# Patient Record
Sex: Female | Born: 1960 | Race: White | Hispanic: No | Marital: Married | Smoking: Never smoker
Health system: Southern US, Community
[De-identification: ages and names within clinical notes are randomized; demographics above are authoritative.]

## PROBLEM LIST (undated history)

## (undated) DIAGNOSIS — R51 Headache: Secondary | ICD-10-CM

## (undated) DIAGNOSIS — I10 Essential (primary) hypertension: Secondary | ICD-10-CM

## (undated) DIAGNOSIS — Z6791 Unspecified blood type, Rh negative: Secondary | ICD-10-CM

## (undated) DIAGNOSIS — N189 Chronic kidney disease, unspecified: Secondary | ICD-10-CM

## (undated) DIAGNOSIS — R519 Headache, unspecified: Secondary | ICD-10-CM

## (undated) DIAGNOSIS — J45909 Unspecified asthma, uncomplicated: Secondary | ICD-10-CM

## (undated) HISTORY — PX: COLONOSCOPY: SHX5424

---

## 1998-03-08 ENCOUNTER — Ambulatory Visit (HOSPITAL_COMMUNITY): Admission: RE | Admit: 1998-03-08 | Discharge: 1998-03-08 | Payer: Self-pay | Admitting: Family Medicine

## 2002-06-16 ENCOUNTER — Encounter (INDEPENDENT_AMBULATORY_CARE_PROVIDER_SITE_OTHER): Payer: Self-pay | Admitting: Specialist

## 2002-06-16 ENCOUNTER — Encounter: Payer: Self-pay | Admitting: Surgery

## 2002-06-16 ENCOUNTER — Encounter: Admission: RE | Admit: 2002-06-16 | Discharge: 2002-06-16 | Payer: Self-pay | Admitting: Surgery

## 2002-07-27 ENCOUNTER — Ambulatory Visit (HOSPITAL_COMMUNITY): Admission: RE | Admit: 2002-07-27 | Discharge: 2002-07-27 | Payer: Self-pay | Admitting: Gastroenterology

## 2003-06-25 ENCOUNTER — Encounter: Admission: RE | Admit: 2003-06-25 | Discharge: 2003-06-25 | Payer: Self-pay | Admitting: Family Medicine

## 2004-06-15 ENCOUNTER — Other Ambulatory Visit: Admission: RE | Admit: 2004-06-15 | Discharge: 2004-06-15 | Payer: Self-pay | Admitting: Family Medicine

## 2004-08-04 ENCOUNTER — Encounter: Admission: RE | Admit: 2004-08-04 | Discharge: 2004-08-04 | Payer: Self-pay | Admitting: Family Medicine

## 2005-07-16 ENCOUNTER — Other Ambulatory Visit: Admission: RE | Admit: 2005-07-16 | Discharge: 2005-07-16 | Payer: Self-pay | Admitting: Family Medicine

## 2005-08-27 ENCOUNTER — Encounter: Admission: RE | Admit: 2005-08-27 | Discharge: 2005-08-27 | Payer: Self-pay | Admitting: Family Medicine

## 2006-10-17 ENCOUNTER — Other Ambulatory Visit: Admission: RE | Admit: 2006-10-17 | Discharge: 2006-10-17 | Payer: Self-pay | Admitting: Family Medicine

## 2006-10-29 ENCOUNTER — Encounter: Admission: RE | Admit: 2006-10-29 | Discharge: 2006-10-29 | Payer: Self-pay | Admitting: Family Medicine

## 2007-02-06 ENCOUNTER — Other Ambulatory Visit: Admission: RE | Admit: 2007-02-06 | Discharge: 2007-02-06 | Payer: Self-pay | Admitting: Family Medicine

## 2007-11-17 ENCOUNTER — Encounter: Admission: RE | Admit: 2007-11-17 | Discharge: 2007-11-17 | Payer: Self-pay | Admitting: Family Medicine

## 2008-08-27 ENCOUNTER — Ambulatory Visit: Payer: Self-pay | Admitting: Diagnostic Radiology

## 2008-08-27 ENCOUNTER — Emergency Department (HOSPITAL_BASED_OUTPATIENT_CLINIC_OR_DEPARTMENT_OTHER): Admission: EM | Admit: 2008-08-27 | Discharge: 2008-08-28 | Payer: Self-pay | Admitting: Emergency Medicine

## 2008-09-02 ENCOUNTER — Emergency Department (HOSPITAL_BASED_OUTPATIENT_CLINIC_OR_DEPARTMENT_OTHER): Admission: EM | Admit: 2008-09-02 | Discharge: 2008-09-02 | Payer: Self-pay | Admitting: Emergency Medicine

## 2008-12-14 ENCOUNTER — Other Ambulatory Visit: Admission: RE | Admit: 2008-12-14 | Discharge: 2008-12-14 | Payer: Self-pay | Admitting: Family Medicine

## 2010-01-04 ENCOUNTER — Encounter: Admission: RE | Admit: 2010-01-04 | Discharge: 2010-01-04 | Payer: Self-pay | Admitting: Family Medicine

## 2010-04-28 ENCOUNTER — Other Ambulatory Visit: Admission: RE | Admit: 2010-04-28 | Discharge: 2010-04-28 | Payer: Self-pay | Admitting: Family Medicine

## 2010-12-04 LAB — BASIC METABOLIC PANEL
BUN: 19 mg/dL (ref 6–23)
Calcium: 9.6 mg/dL (ref 8.4–10.5)
Creatinine, Ser: 1 mg/dL (ref 0.4–1.2)
GFR calc non Af Amer: 60 mL/min — ABNORMAL LOW (ref >60)
Glucose, Bld: 119 mg/dL — ABNORMAL HIGH (ref 70–99)
Potassium: 3.8 mEq/L (ref 3.5–5.1)

## 2010-12-04 LAB — CBC
Platelets: 278 10*3/uL (ref 150–400)
RDW: 12.4 % (ref 11.5–15.5)
WBC: 12.4 10*3/uL — ABNORMAL HIGH (ref 4.0–10.5)

## 2010-12-04 LAB — DIFFERENTIAL
Basophils Absolute: 0.1 10*3/uL (ref 0.0–0.1)
Eosinophils Relative: 2 % (ref 0–5)
Lymphocytes Relative: 25 % (ref 12–46)
Lymphs Abs: 3.2 10*3/uL (ref 0.7–4.0)
Neutro Abs: 7.8 10*3/uL — ABNORMAL HIGH (ref 1.7–7.7)
Neutrophils Relative %: 63 % (ref 43–77)

## 2011-01-05 NOTE — Op Note (Signed)
   NAME:  Rhonda Walker, Rhonda Walker                          ACCOUNT NO.:  [id number]   MEDICAL RECORD NO.:  [id number]                   PATIENT TYPE:  AMB   LOCATION:  ENDO                                 FACILITY:  [facility]   PHYSICIAN:  John C. Madilyn Fireman, M.D.                 DATE OF BIRTH:  Apr 29, 1961   DATE OF PROCEDURE:  07/27/2002  DATE OF DISCHARGE:                                 OPERATIVE REPORT   INDICATIONS FOR PROCEDURE:  Family history of colon cancer in a first-degree  relative.   PROCEDURE:  The patient was placed in the left lateral decubitus position  and placed on the pulse monitor with continuous low-flow oxygen delivered by  nasal cannula.  She was sedated with 75 mcg of IV fentanyl and 6 mg of IV  Versed.  The Olympus video colonoscope was inserted into the rectum and  advanced to the cecum, confirmed by transillumination of the McBurney's  point and visualization of the ileocecal valve and appendiceal orifice.  The  prep was excellent.  The cecum, ascending, transverse, descending, and  sigmoid colon all appeared normal with no masses, polyps, diverticula, or  other mucosal abnormalities.  The rectum likewise appeared normal, and the  retroflexed view of the anus revealed no obvious internal hemorrhoids.  The  colonoscope was then withdrawn, and the patient was returned to the recovery  room in stable condition.  She tolerated the procedure well, and there were  no immediate complications.   IMPRESSION:  Normal colonoscopy.   PLAN:  Repeat study in five years.                                               John C. Madilyn Fireman, M.D.    JCH/MEDQ  D:  07/27/2002  T:  07/27/2002  Job:  [id number]   cc:   Jethro Bastos, M.D.  [address]  [place]  [place] [address]  Fax: [phone]

## 2011-04-19 ENCOUNTER — Other Ambulatory Visit: Payer: Self-pay | Admitting: Family Medicine

## 2011-04-19 DIAGNOSIS — Z1231 Encounter for screening mammogram for malignant neoplasm of breast: Secondary | ICD-10-CM

## 2011-04-27 ENCOUNTER — Ambulatory Visit
Admission: RE | Admit: 2011-04-27 | Discharge: 2011-04-27 | Disposition: A | Discharge status: Home / Self Care | Payer: BC Managed Care – PPO | Source: Ambulatory Visit | Attending: Family Medicine | Admitting: Family Medicine

## 2011-04-27 DIAGNOSIS — Z1231 Encounter for screening mammogram for malignant neoplasm of breast: Secondary | ICD-10-CM

## 2013-06-24 ENCOUNTER — Other Ambulatory Visit: Payer: Self-pay | Admitting: Family Medicine

## 2013-06-24 ENCOUNTER — Other Ambulatory Visit (HOSPITAL_COMMUNITY)
Admission: RE | Admit: 2013-06-24 | Discharge: 2013-06-24 | Disposition: A | Discharge status: Home / Self Care | Payer: BC Managed Care – PPO | Source: Ambulatory Visit | Attending: Family Medicine | Admitting: Family Medicine

## 2013-06-24 DIAGNOSIS — Z124 Encounter for screening for malignant neoplasm of cervix: Secondary | ICD-10-CM | POA: Insufficient documentation

## 2013-11-16 ENCOUNTER — Other Ambulatory Visit: Payer: Self-pay

## 2013-11-16 DIAGNOSIS — Z1231 Encounter for screening mammogram for malignant neoplasm of breast: Secondary | ICD-10-CM

## 2013-12-28 ENCOUNTER — Encounter (INDEPENDENT_AMBULATORY_CARE_PROVIDER_SITE_OTHER): Payer: Self-pay

## 2013-12-28 ENCOUNTER — Ambulatory Visit
Admission: RE | Admit: 2013-12-28 | Discharge: 2013-12-28 | Disposition: A | Discharge status: Home / Self Care | Payer: BC Managed Care – PPO | Source: Ambulatory Visit

## 2013-12-28 DIAGNOSIS — Z1231 Encounter for screening mammogram for malignant neoplasm of breast: Secondary | ICD-10-CM

## 2015-03-11 ENCOUNTER — Other Ambulatory Visit: Payer: Self-pay | Admitting: Obstetrics and Gynecology

## 2015-04-05 ENCOUNTER — Other Ambulatory Visit: Payer: Self-pay

## 2015-04-05 ENCOUNTER — Encounter (HOSPITAL_COMMUNITY)
Admission: RE | Admit: 2015-04-05 | Discharge: 2015-04-05 | Disposition: A | Discharge status: Home / Self Care | Payer: BLUE CROSS/BLUE SHIELD | Source: Ambulatory Visit | Attending: Obstetrics and Gynecology | Admitting: Obstetrics and Gynecology

## 2015-04-05 ENCOUNTER — Encounter (HOSPITAL_COMMUNITY): Payer: Self-pay

## 2015-04-05 DIAGNOSIS — N939 Abnormal uterine and vaginal bleeding, unspecified: Secondary | ICD-10-CM | POA: Diagnosis not present

## 2015-04-05 DIAGNOSIS — Z01818 Encounter for other preprocedural examination: Secondary | ICD-10-CM | POA: Diagnosis not present

## 2015-04-05 HISTORY — DX: Unspecified asthma, uncomplicated: J45.909

## 2015-04-05 HISTORY — DX: Essential (primary) hypertension: I10

## 2015-04-05 HISTORY — DX: Headache, unspecified: R51.9

## 2015-04-05 HISTORY — DX: Chronic kidney disease, unspecified: N18.9

## 2015-04-05 HISTORY — DX: Unspecified blood type, rh negative: Z67.91

## 2015-04-05 HISTORY — DX: Headache: R51

## 2015-04-05 LAB — BASIC METABOLIC PANEL
Anion gap: 8 (ref 5–15)
BUN: 15 mg/dL (ref 6–20)
CO2: 25 mmol/L (ref 22–32)
CREATININE: 0.96 mg/dL (ref 0.44–1.00)
Calcium: 9.5 mg/dL (ref 8.9–10.3)
Chloride: 107 mmol/L (ref 101–111)
Glucose, Bld: 96 mg/dL (ref 65–99)
POTASSIUM: 4.2 mmol/L (ref 3.5–5.1)
SODIUM: 140 mmol/L (ref 135–145)

## 2015-04-05 LAB — CBC
HCT: 36.6 % (ref 36.0–46.0)
HEMOGLOBIN: 12.2 g/dL (ref 12.0–15.0)
MCH: 30.7 pg (ref 26.0–34.0)
MCHC: 33.3 g/dL (ref 30.0–36.0)
MCV: 92.2 fL (ref 78.0–100.0)
PLATELETS: 329 10*3/uL (ref 150–400)
RBC: 3.97 MIL/uL (ref 3.87–5.11)
RDW: 14 % (ref 11.5–15.5)
WBC: 8.4 10*3/uL (ref 4.0–10.5)

## 2015-04-05 NOTE — Patient Instructions (Signed)
Your procedure is scheduled on:04/13/15  Enter through the Main Entrance at :6am Pick up desk phone and dial [phone] and inform us of your arrival.  Please call [phone] if you have any problems the morning of surgery.  Remember: Do not eat food or drink liquids, including water, after midnight:Tuesday   You may brush your teeth the morning of surgery.  Take these meds the morning of surgery with a sip of water:BP meds  DO NOT wear jewelry, eye make-up, lipstick,body lotion, or dark fingernail polish.  (Polished toes are ok) You may wear deodorant.  If you are to be admitted after surgery, leave suitcase in car until your room has been assigned. Patients discharged on the day of surgery will not be allowed to drive home. Wear loose fitting, comfortable clothes for your ride home.

## 2015-04-06 NOTE — Anesthesia Preprocedure Evaluation (Addendum)
Anesthesia Evaluation  Patient identified by MRN, date of birth, ID band Patient awake    Reviewed: Allergy & Precautions, NPO status , Patient's Chart, lab work & pertinent test results  Airway Mallampati: II   Neck ROM: Full    Dental  (+) Caps, Dental Advisory Given, Teeth Intact   Pulmonary  breath sounds clear to auscultation        Cardiovascular hypertension, Pt. on medications Rhythm:Regular  EKG 03/2015 OK   Neuro/Psych  Headaches,    GI/Hepatic   Endo/Other  BMI 35, obesity  Renal/GU      Musculoskeletal   Abdominal (+)  Abdomen: soft.    Peds  Hematology 12/36   Anesthesia Other Findings   Reproductive/Obstetrics                          Anesthesia Physical Anesthesia Plan  ASA: II  Anesthesia Plan: General   Post-op Pain Management:    Induction: Intravenous  Airway Management Planned: LMA and Oral ETT  Additional Equipment:   Intra-op Plan:   Post-operative Plan:   Informed Consent: I have reviewed the patients History and Physical, chart, labs and discussed the procedure including the risks, benefits and alternatives for the proposed anesthesia with the patient or authorized representative who has indicated his/her understanding and acceptance.     Plan Discussed with:   Anesthesia Plan Comments:         Anesthesia Quick Evaluation

## 2015-04-11 ENCOUNTER — Encounter: Payer: Self-pay | Admitting: Allergy and Immunology

## 2015-04-11 DIAGNOSIS — J309 Allergic rhinitis, unspecified: Secondary | ICD-10-CM | POA: Insufficient documentation

## 2015-04-12 NOTE — H&P (Signed)
History of Present Illness  General:  54 y/o presents for ultrasound ordered to evaluate AUB. EMB previously done showed an endometrial polyp but no hyperplasia or malignancy. Pt had prolonged bleeding after she did not have menses for ~ 6 months and they have been heavy since. Pt had episode of bleeding Pt stopped Provera on Sunday. Bleeding resumed on Tuesday. Bleeding stopped 5 days after starting Provera. Agreeable to proceed with definitive management.   Current Medications  Taking   Multivitamins Tablet 1 tablet once a day   Calcium  Tablet 1 tablet once a day   Labetalol HCl 300 MG Tablet 1 tablet twice a day   Spironolactone 50 MG Tablet 1 tablet twice a day   Azelastine HCl 0.15 % Solution 1 drop in each nostril Twice a day   Albuterol Sulfate HFA 108 (90 Base) MCG/ACT Aerosol Solution 2 puffs as needed every 4 hrs   Provera(Medroxyprogesterone) 10 MG Tablet 1 table One tablet daiy   Medication List reviewed and reconciled with the patient    Past Medical History  Acne  Migraine headaches  Depression  F. hx. colon cancer - mother dx 12/1994  Hypertension  polycystic ovary (normal pelvic US 2010)  Congenital nystagmus  actinic keratosis, DER: [facility] in [place]  low HDL  Prediabetes/metabolic syndrome   Surgical History  Abortion 02/1998  colonoscopy 06/06/2009,06/2014   Family History  Father: deceased 56 yrs, skin cancers, high blood pressure, heart disease, diabetes, alcoholism, COPD, diagnosed with DM, HTN  Mother: alive 14 yrs, hx colon cancer, high blood pressure, diagnosed with HTN, Colon Ca  Brother 1: alive 50 yrs, A + W  Sister 1: alive 52 yrs, hypertension, squamous cell skin cancers, diagnosed with HTN  NEG GI FAMILY HX of colon polyps and liver disease.   Social History  General:  Tobacco use  cigarettes: Never smoked Tobacco history last updated 03/29/2015 no Exposed to passive smoke.  Alcohol: yes, wine, 1 glass a month .  Caffeine: yes,  coffee, 1 serving daily.  no Recreational drug use.  Exercise: yes, walk occasionally .  Occupation: employed, Psychologist, educational .  Marital Status: Married.  Children: none.  Seat belt use: yes.    Gyn History  Sexual activity currently sexually active.  Periods : irregular.  LMP 01/24/15 x 4 weeks then stopped a few days and then started again,stopped with provera x 10 days then started again after finishing the provera. Taking provera again per Dr. Reine Just pt sees Dr. Seth Bake..  Denies H/O Birth control.  Last pap smear date 06/24/13 normal .  Last mammogram date 12/28/13 normal .  Abnormal pap smear none.  GYN procedures Endometrial Biopsy, 03/11/15, benign.    OB History  Number of pregnancies Gravida 1; Para 0; AB 1.  Pregnancy # 1 abortion.    Allergies  N.K.D.A.   Hospitalization/Major Diagnostic Procedure  Denies Past Hospitalization   Review of Systems  Denies fever/chills, chest pain, SOB, headaches, numbness/tingling. No h/o complication with anesthesia, bleeding disorders or blood clots.   Vital Signs  Wt 210, Ht 64, BMI 36.04, Pulse sitting 77, BP sitting 141/80.   Physical Examination  GENERAL:  Patient appears alert and oriented.  General Appearance: well-appearing, well-developed, no acute distress.  Speech: clear.  LUNGS:  Auscultation: no wheezing/rhonchi/rales. CTA bilaterally.  HEART:  Heart sounds: normal. RRR. no murmur.  ABDOMEN:  General: soft nontender, nondistended, no masses.  FEMALE GENITOURINARY:  Pelvic Not examined.  EXTREMITIES:  General: No edema or calf  tenderness.     Assessments   1. Abnormal uterine bleeding (AUB) - N93.9 (Primary)   2. Pre-operative clearance - Z01.818   Treatment  1. Abnormal uterine bleeding (AUB)  Refill Provera Tablet, 10 MG, 1 table, Orally, One tablet daiy, 10 days, 10, Refills 0   2. Pre-operative clearance  Clinical Notes: Pt counseled on R/B/A to include injury to uterus, bleeding and infection. Pt  understands procedure is diagnostic.    3. Others  Endometrial polyp   Referral ZO:[facility]cology Reason:Precert for Hyst/D&C, Polypectomy with MyosureNeeds surgical clearance, no asssitant needed    Follow Up  2 Weeks post op

## 2015-04-13 ENCOUNTER — Ambulatory Visit (HOSPITAL_COMMUNITY)
Admission: RE | Admit: 2015-04-13 | Discharge: 2015-04-13 | Disposition: A | Discharge status: Home / Self Care | Payer: BLUE CROSS/BLUE SHIELD | Source: Ambulatory Visit | Attending: Obstetrics and Gynecology | Admitting: Obstetrics and Gynecology

## 2015-04-13 ENCOUNTER — Ambulatory Visit (HOSPITAL_COMMUNITY): Payer: BLUE CROSS/BLUE SHIELD | Admitting: Anesthesiology

## 2015-04-13 ENCOUNTER — Encounter (HOSPITAL_COMMUNITY)
Admission: RE | Disposition: A | Discharge status: Home / Self Care | Payer: Self-pay | Source: Ambulatory Visit | Attending: Obstetrics and Gynecology

## 2015-04-13 ENCOUNTER — Encounter (HOSPITAL_COMMUNITY): Payer: Self-pay | Admitting: *Deleted

## 2015-04-13 DIAGNOSIS — Z809 Family history of malignant neoplasm, unspecified: Secondary | ICD-10-CM | POA: Diagnosis not present

## 2015-04-13 DIAGNOSIS — Z833 Family history of diabetes mellitus: Secondary | ICD-10-CM | POA: Diagnosis not present

## 2015-04-13 DIAGNOSIS — E669 Obesity, unspecified: Secondary | ICD-10-CM | POA: Insufficient documentation

## 2015-04-13 DIAGNOSIS — Z79899 Other long term (current) drug therapy: Secondary | ICD-10-CM | POA: Diagnosis not present

## 2015-04-13 DIAGNOSIS — Z800 Family history of malignant neoplasm of digestive organs: Secondary | ICD-10-CM | POA: Diagnosis not present

## 2015-04-13 DIAGNOSIS — I10 Essential (primary) hypertension: Secondary | ICD-10-CM | POA: Insufficient documentation

## 2015-04-13 DIAGNOSIS — H5501 Congenital nystagmus: Secondary | ICD-10-CM | POA: Diagnosis not present

## 2015-04-13 DIAGNOSIS — Q513 Bicornate uterus: Secondary | ICD-10-CM | POA: Diagnosis not present

## 2015-04-13 DIAGNOSIS — Z8249 Family history of ischemic heart disease and other diseases of the circulatory system: Secondary | ICD-10-CM | POA: Insufficient documentation

## 2015-04-13 DIAGNOSIS — N840 Polyp of corpus uteri: Secondary | ICD-10-CM | POA: Insufficient documentation

## 2015-04-13 DIAGNOSIS — R7309 Other abnormal glucose: Secondary | ICD-10-CM | POA: Diagnosis not present

## 2015-04-13 DIAGNOSIS — E8881 Metabolic syndrome: Secondary | ICD-10-CM | POA: Insufficient documentation

## 2015-04-13 DIAGNOSIS — Z825 Family history of asthma and other chronic lower respiratory diseases: Secondary | ICD-10-CM | POA: Insufficient documentation

## 2015-04-13 DIAGNOSIS — N939 Abnormal uterine and vaginal bleeding, unspecified: Secondary | ICD-10-CM

## 2015-04-13 DIAGNOSIS — F329 Major depressive disorder, single episode, unspecified: Secondary | ICD-10-CM | POA: Diagnosis not present

## 2015-04-13 HISTORY — PX: DILATATION & CURETTAGE/HYSTEROSCOPY WITH MYOSURE: SHX6511

## 2015-04-13 SURGERY — DILATATION & CURETTAGE/HYSTEROSCOPY WITH MYOSURE
Anesthesia: General | Site: Vagina

## 2015-04-13 MED ORDER — KETOROLAC TROMETHAMINE 30 MG/ML IJ SOLN
INTRAMUSCULAR | Status: AC
  Filled 2015-04-13: qty 1

## 2015-04-13 MED ORDER — PROMETHAZINE HCL 25 MG/ML IJ SOLN
6.2500 mg | INTRAMUSCULAR | Status: DC | PRN
Start: 2015-04-13 — End: 2015-04-13

## 2015-04-13 MED ORDER — LIDOCAINE HCL (CARDIAC) 20 MG/ML IV SOLN
INTRAVENOUS | Status: DC | PRN
  Administered 2015-04-13: 100 mg via INTRAVENOUS
  Administered 2015-04-13: 50 mg via INTRAVENOUS

## 2015-04-13 MED ORDER — FENTANYL CITRATE (PF) 100 MCG/2ML IJ SOLN
INTRAMUSCULAR | Status: AC
  Filled 2015-04-13: qty 4

## 2015-04-13 MED ORDER — PROPOFOL 10 MG/ML IV BOLUS
INTRAVENOUS | Status: DC | PRN
  Administered 2015-04-13: 200 mg via INTRAVENOUS
  Administered 2015-04-13: 50 mg via INTRAVENOUS

## 2015-04-13 MED ORDER — LIDOCAINE HCL (CARDIAC) 20 MG/ML IV SOLN
INTRAVENOUS | Status: AC
  Filled 2015-04-13: qty 5

## 2015-04-13 MED ORDER — IBUPROFEN 600 MG PO TABS: 600.0000 mg | ORAL_TABLET | Freq: Four times a day (QID) | ORAL | Status: DC | PRN

## 2015-04-13 MED ORDER — CEFAZOLIN SODIUM-DEXTROSE 2-3 GM-% IV SOLR
2.0000 g | INTRAVENOUS | Status: AC
  Administered 2015-04-13: 2 g via INTRAVENOUS

## 2015-04-13 MED ORDER — PROPOFOL 10 MG/ML IV BOLUS
INTRAVENOUS | Status: AC
  Filled 2015-04-13: qty 20

## 2015-04-13 MED ORDER — DEXAMETHASONE SODIUM PHOSPHATE 4 MG/ML IJ SOLN
INTRAMUSCULAR | Status: AC
  Filled 2015-04-13: qty 1

## 2015-04-13 MED ORDER — SCOPOLAMINE 1 MG/3DAYS TD PT72
1.0000 | MEDICATED_PATCH | Freq: Once | TRANSDERMAL | Status: DC
  Administered 2015-04-13: 1.5 mg via TRANSDERMAL

## 2015-04-13 MED ORDER — LACTATED RINGERS IV SOLN
INTRAVENOUS | Status: DC
  Administered 2015-04-13: 07:00:00 via INTRAVENOUS

## 2015-04-13 MED ORDER — LIDOCAINE HCL 2 % IJ SOLN
INTRAMUSCULAR | Status: AC
  Filled 2015-04-13: qty 20

## 2015-04-13 MED ORDER — MEPERIDINE HCL 25 MG/ML IJ SOLN: 6.2500 mg | INTRAMUSCULAR | Status: DC | PRN

## 2015-04-13 MED ORDER — FENTANYL CITRATE (PF) 100 MCG/2ML IJ SOLN: 25.0000 ug | INTRAMUSCULAR | Status: DC | PRN

## 2015-04-13 MED ORDER — SODIUM CHLORIDE 0.9 % IR SOLN
Status: DC | PRN
  Administered 2015-04-13: 3000 mL

## 2015-04-13 MED ORDER — SCOPOLAMINE 1 MG/3DAYS TD PT72
MEDICATED_PATCH | TRANSDERMAL | Status: AC
  Administered 2015-04-13: 1.5 mg via TRANSDERMAL
  Filled 2015-04-13: qty 1

## 2015-04-13 MED ORDER — DEXAMETHASONE SODIUM PHOSPHATE 10 MG/ML IJ SOLN
INTRAMUSCULAR | Status: DC | PRN
  Administered 2015-04-13: 4 mg via INTRAVENOUS

## 2015-04-13 MED ORDER — SILVER NITRATE-POT NITRATE 75-25 % EX MISC
CUTANEOUS | Status: DC | PRN
  Administered 2015-04-13: 2

## 2015-04-13 MED ORDER — FENTANYL CITRATE (PF) 100 MCG/2ML IJ SOLN
INTRAMUSCULAR | Status: DC | PRN
  Administered 2015-04-13 (×2): 50 ug via INTRAVENOUS

## 2015-04-13 MED ORDER — MIDAZOLAM HCL 2 MG/2ML IJ SOLN
INTRAMUSCULAR | Status: AC
  Filled 2015-04-13: qty 4

## 2015-04-13 MED ORDER — SILVER NITRATE-POT NITRATE 75-25 % EX MISC
CUTANEOUS | Status: AC
  Filled 2015-04-13: qty 1

## 2015-04-13 MED ORDER — KETOROLAC TROMETHAMINE 30 MG/ML IJ SOLN
INTRAMUSCULAR | Status: DC | PRN
  Administered 2015-04-13: 30 mg via INTRAVENOUS

## 2015-04-13 MED ORDER — ONDANSETRON HCL 4 MG/2ML IJ SOLN
INTRAMUSCULAR | Status: DC | PRN
  Administered 2015-04-13: 4 mg via INTRAVENOUS

## 2015-04-13 MED ORDER — MIDAZOLAM HCL 2 MG/2ML IJ SOLN
INTRAMUSCULAR | Status: DC | PRN
  Administered 2015-04-13: 2 mg via INTRAVENOUS

## 2015-04-13 MED ORDER — ONDANSETRON HCL 4 MG/2ML IJ SOLN
INTRAMUSCULAR | Status: AC
  Filled 2015-04-13: qty 2

## 2015-04-13 MED ORDER — CEFAZOLIN SODIUM-DEXTROSE 2-3 GM-% IV SOLR
INTRAVENOUS | Status: AC
  Filled 2015-04-13: qty 50

## 2015-04-13 MED ORDER — LIDOCAINE HCL 2 % IJ SOLN
INTRAMUSCULAR | Status: DC | PRN
  Administered 2015-04-13: 10 mL

## 2015-04-13 SURGICAL SUPPLY — 14 items
CANISTERS HI-FLOW 3000CC (CANNISTER) ×4 IMPLANT
CATH ROBINSON RED A/P 16FR (CATHETERS) ×2 IMPLANT
CLOTH BEACON ORANGE TIMEOUT ST (SAFETY) ×2 IMPLANT
CONTAINER PREFILL 10% NBF 60ML (FORM) ×2 IMPLANT
DECANTER SPIKE VIAL GLASS SM (MISCELLANEOUS) ×2 IMPLANT
DEVICE MYOSURE LITE (MISCELLANEOUS) ×2 IMPLANT
GLOVE BIO SURGEON STRL SZ7 (GLOVE) ×4 IMPLANT
GLOVE BIOGEL PI IND STRL 7.0 (GLOVE) IMPLANT
GLOVE BIOGEL PI INDICATOR 7.0 (GLOVE) ×2
GOWN STRL REUS W/TWL LRG LVL3 (GOWN DISPOSABLE) ×6 IMPLANT
PACK VAGINAL MINOR WOMEN LF (CUSTOM PROCEDURE TRAY) ×2 IMPLANT
PAD OB MATERNITY 4.3X12.25 (PERSONAL CARE ITEMS) ×2 IMPLANT
PAD PREP 24X48 CUFFED NSTRL (MISCELLANEOUS) ×2 IMPLANT
TOWEL OR 17X24 6PK STRL BLUE (TOWEL DISPOSABLE) ×4 IMPLANT

## 2015-04-13 NOTE — Discharge Instructions (Addendum)
Dilation and Curettage or Vacuum Curettage, Care After Refer to this sheet in the next few weeks. These instructions provide you with information on caring for yourself after your procedure. Your health care provider may also give you more specific instructions. Your treatment has been planned according to current medical practices, but problems sometimes occur. Call your health care provider if you have any problems or questions after your procedure. WHAT TO EXPECT AFTER THE PROCEDURE After your procedure, it is typical to have light cramping and bleeding. This may last for 2 days to 2 weeks after the procedure. HOME CARE INSTRUCTIONS   Do not drive for 24 hours.  Wait 1 week before returning to strenuous activities.  Take your temperature if you feel warm or have chills. Provide these temperatures to your health care provider if you develop a fever.  Avoid long periods of standing.  Avoid heavy lifting, pushing, or pulling. Do not lift anything heavier than 10 pounds (4.5 kg).  Limit stair climbing to once or twice a day.  Take rest periods often.  You may resume your usual diet.  Drink enough fluids to keep your urine clear or pale yellow.  Your usual bowel function should return. If you have constipation, you may:  Take a mild laxative with permission from your health care provider.  Add fruit and bran to your diet.  Drink more fluids.  Take showers instead of baths until your health care provider gives you permission to take baths.  Do not go swimming or use a hot tub until your health care provider approves.  Try to have someone with you or available to you the first 24-48 hours, especially if you were given a general anesthetic.  Do not douche, use tampons, or have sex (intercourse) for 2 weeks after the procedure.  Only take over-the-counter or prescription medicines as directed by your health care provider. Do not take aspirin. It can cause bleeding.  Follow up with  your health care provider as directed. SEEK MEDICAL CARE IF:   You have increasing cramps or pain that is not relieved with medicine.  You have abdominal pain that does not seem to be related to the same area of earlier cramping and pain.  You have bad smelling vaginal discharge.  You have a rash.  You are having problems with any medicine. SEEK IMMEDIATE MEDICAL CARE IF:   You have bleeding that is heavier than a normal menstrual period.  You have a fever.  You have chest pain.  You have shortness of breath.  You feel dizzy or feel like fainting.  You pass out.  You have pain in your shoulder strap area.  You have heavy vaginal bleeding with or without blood clots. MAKE SURE YOU:   Understand these instructions.  Will watch your condition.  Will get help right away if you are not doing well or get worse. Document Released: 08/03/2000 Document Revised: 08/11/2013 Document Reviewed: 03/05/2013 [facility] Patient Information 2015 [place], [place]. This information is not intended to replace advice given to you by your health care provider. Make sure you discuss any questions you have with your health care provider.

## 2015-04-13 NOTE — Transfer of Care (Signed)
Immediate Anesthesia Transfer of Care Note  Patient: Rhonda Walker  Procedure(s) Performed: Procedure(s): DILATATION & CURETTAGE/HYSTEROSCOPY WITH MYOSURE (N/A)  Patient Location: PACU  Anesthesia Type:General  Level of Consciousness: awake and sedated  Airway & Oxygen Therapy: Patient Spontanous Breathing and Patient connected to nasal cannula oxygen  Post-op Assessment: Report given to RN and Post -op Vital signs reviewed and stable  Post vital signs: Reviewed and stable  Last Vitals:  Filed Vitals:   04/13/15 0614  BP: 126/84  Pulse: 84  Temp: 37 C  Resp: 18    Complications: No apparent anesthesia complications

## 2015-04-13 NOTE — Interval H&P Note (Signed)
History and Physical Interval Note:  04/13/2015 7:11 AM  Rhonda Walker  has presented today for surgery, with the diagnosis of N93.9 Abnormal Uterine Bleeding  The various methods of treatment have been discussed with the patient and family. After consideration of risks, benefits and other options for treatment, the patient has consented to  Procedure(s): DILATATION & CURETTAGE/HYSTEROSCOPY WITH MYOSURE (N/A) as a surgical intervention .  The patient's history has been reviewed, patient examined, no change in status, stable for surgery.  I have reviewed the patient's chart and labs.  Questions were answered to the patient's satisfaction.    Bleeding slowed.    Dion Body, Rickita Forstner

## 2015-04-13 NOTE — Op Note (Signed)
NAMERICK, WARNICK NO.:  [id number]  MEDICAL RECORD NO.:  [id number]  LOCATION:  [facility]                          FACILITY:  [facility]  PHYSICIAN:  Pieter Partridge, MD   DATE OF BIRTH:  [birth year]  DATE OF PROCEDURE:  04/13/2015 DATE OF DISCHARGE:  04/13/2015                              OPERATIVE REPORT   PREOPERATIVE DIAGNOSES:  Abnormal uterine bleeding, endometrial mass.  POSTOPERATIVE DIAGNOSES:  Abnormal uterine bleeding, endometrial mass, endometrial polyp, bicornuate uterus.  PROCEDURE:  Hysteroscopy, D and C, with MyoSure resection of polyp.  SURGEON:  Pieter Partridge, M.D.  PHYSICIAN ASSISTANT:  None.  ANESTHESIA:  General.  ESTIMATED BLOOD LOSS:  25.  DRAINS:  None.  LOCAL:  2% lidocaine.  SPECIMENS:  Endometrial polyps and curettings.  Disposition of specimen to Pathology.  DISPOSITION:  The patient's disposition to PACU, hemodynamically stable.  COMPLICATIONS:  None.  FINDINGS:  Long endometrial polyp appears benign, irregularities in the myometrium consistent with probably a polyp or small fibroid. Bicornuate uterus.  Normal endocervical canal.  DESCRIPTION OF PROCEDURE:  Rhonda Walker was identified in the holding area.  She was then taken to the operating room with IV running.  She underwent general anesthesia without complication.  She was then placed in dorsal lithotomy position and prepped and draped in the usual fashion.  I and O of bladder was performed.  Graves speculum was inserted into the vagina.  The cervix was visualized.  Anterior lip of the cervix was injected with lidocaine. Single-tooth tenaculum was then applied to the anterior lip of the cervix.  Paracervical block was performed, 10 mL of lidocaine used.  Cervix was then dilated up to a 27 Pratt.  Hysteroscope was then advanced.  The findings above were noted.  The MyoSure device was then inserted and ablation or morcellation of the polyp was noted.  The patient did  have a bicornuate uterus, so I attempted to get most of the curettings with MyoSure instead of using a curette blindly.  So, I performed a curettage of the entire endometrial cavity with the MyoSure. I then gently, after I removed the MyoSure, went ahead with a sharp curette, advanced slowly to the septum and curetted the lower uterine segment.  Went back and there was no active bleeding noted after that procedure was performed.  The hysteroscope was removed.  Single-tooth tenaculum was removed with active bleeding from the tenaculum site.  Pressure was held and silver nitrate x2 was used for hemostasis.  The patient has slow ooze from the cervix initially, but with pressure, it slowed down. All instrument and sponge counts were correct x3.  The patient tolerated the procedure well.  She went to the PACU in stable condition.  Ancef 2 g IV was used prior to the surgery and time-out was taken prior to the surgery.     Pieter Partridge, MD     EBV/MEDQ  D:  04/13/2015  T:  04/13/2015  Job:  [id number]

## 2015-04-13 NOTE — Brief Op Note (Signed)
04/13/2015  8:21 AM  PATIENT:  Rhonda Walker  54 y.o. female  PRE-OPERATIVE DIAGNOSIS:  N93.9 Abnormal Uterine Bleeding, Endometrial mass  POST-OPERATIVE DIAGNOSIS: Same, endometrial polyp, bicornuate uterus  PROCEDURE:  Procedure(s): DILATATION & CURETTAGE/HYSTEROSCOPY WITH MYOSURE (N/A)  SURGEON:  Surgeon(s) and Role:    * Geryl Rankins, MD - Primary  PHYSICIAN ASSISTANT: None  ASSISTANTS: None   ANESTHESIA:   general  EBL:  Total I/O In: -  Out: 75 [Urine:50; Blood:25]  BLOOD ADMINISTERED:none  DRAINS: none   LOCAL MEDICATIONS USED:  LIDOCAINE   SPECIMEN:  Source of Specimen:  endometrial polyps and currettings  DISPOSITION OF SPECIMEN:  PATHOLOGY  COUNTS:  YES  TOURNIQUET:  * No tourniquets in log *  DICTATION: .Other Dictation: Dictation Number [id number]  PLAN OF CARE: Discharge to home after PACU  PATIENT DISPOSITION:  PACU - hemodynamically stable.   Delay start of Pharmacological VTE agent (>24hrs) due to surgical blood loss or risk of bleeding: yes

## 2015-04-13 NOTE — Anesthesia Postprocedure Evaluation (Signed)
  Anesthesia Post-op Note  Patient: Rhonda Walker  Procedure(s) Performed: Procedure(s): DILATATION & CURETTAGE/HYSTEROSCOPY WITH MYOSURE (N/A)  Patient Location: PACU  Anesthesia Type:General  Level of Consciousness: awake  Airway and Oxygen Therapy: Patient Spontanous Breathing  Post-op Pain: mild  Post-op Assessment: Post-op Vital signs reviewed, Patient's Cardiovascular Status Stable, Respiratory Function Stable, Patent Airway and No signs of Nausea or vomiting              Post-op Vital Signs: Reviewed and stable  Last Vitals:  Filed Vitals:   04/13/15 0614  BP: 126/84  Pulse: 84  Temp: 37 C  Resp: 18    Complications: No apparent anesthesia complications

## 2015-04-13 NOTE — Anesthesia Procedure Notes (Signed)
Procedure Name: LMA Insertion Date/Time: 04/13/2015 7:28 AM Performed by: Cleda Clarks Pre-anesthesia Checklist: Patient identified, Timeout performed, Emergency Drugs available, Suction available and Patient being monitored Patient Re-evaluated:Patient Re-evaluated prior to inductionOxygen Delivery Method: Circle system utilized Preoxygenation: Pre-oxygenation with 100% oxygen Intubation Type: IV induction Ventilation: Mask ventilation without difficulty LMA: LMA inserted LMA Size: 4.0 Tube type: Oral Number of attempts: 1 Airway Equipment and Method: Bite block Placement Confirmation: positive ETCO2 and breath sounds checked- equal and bilateral Tube secured with: Tape Dental Injury: Teeth and Oropharynx as per pre-operative assessment

## 2015-04-14 ENCOUNTER — Encounter (HOSPITAL_COMMUNITY): Payer: Self-pay | Admitting: Obstetrics and Gynecology

## 2015-04-14 NOTE — Addendum Note (Signed)
Addendum  created 04/14/15 1612 by Randa Spike, CRNA   Modules edited: Charges VN

## 2015-07-28 ENCOUNTER — Telehealth: Payer: Self-pay | Admitting: Allergy

## 2015-07-28 MED ORDER — BECLOMETHASONE DIPROPIONATE 40 MCG/ACT IN AERS: 1.0000 | INHALATION_SPRAY | Freq: Two times a day (BID) | RESPIRATORY_TRACT | Status: DC

## 2015-07-28 NOTE — Telephone Encounter (Signed)
error 

## 2015-11-23 ENCOUNTER — Other Ambulatory Visit: Payer: Self-pay | Admitting: Allergy

## 2015-11-23 MED ORDER — AZELASTINE HCL 0.1 % NA SOLN: 2.0000 | Freq: Once | NASAL | Status: DC

## 2016-06-19 ENCOUNTER — Other Ambulatory Visit: Payer: Self-pay | Admitting: Family Medicine

## 2016-06-19 DIAGNOSIS — Z1231 Encounter for screening mammogram for malignant neoplasm of breast: Secondary | ICD-10-CM

## 2016-07-09 ENCOUNTER — Ambulatory Visit
Admission: RE | Admit: 2016-07-09 | Discharge: 2016-07-09 | Disposition: A | Discharge status: Home / Self Care | Payer: BLUE CROSS/BLUE SHIELD | Source: Ambulatory Visit | Attending: Family Medicine | Admitting: Family Medicine

## 2016-07-09 DIAGNOSIS — Z1231 Encounter for screening mammogram for malignant neoplasm of breast: Secondary | ICD-10-CM

## 2016-07-16 ENCOUNTER — Other Ambulatory Visit: Payer: Self-pay | Admitting: Family Medicine

## 2016-07-16 DIAGNOSIS — R928 Other abnormal and inconclusive findings on diagnostic imaging of breast: Secondary | ICD-10-CM

## 2016-07-18 ENCOUNTER — Other Ambulatory Visit: Payer: Self-pay | Admitting: Obstetrics and Gynecology

## 2016-07-18 ENCOUNTER — Other Ambulatory Visit (HOSPITAL_COMMUNITY)
Admission: RE | Admit: 2016-07-18 | Discharge: 2016-07-18 | Disposition: A | Discharge status: Home / Self Care | Payer: BLUE CROSS/BLUE SHIELD | Source: Ambulatory Visit | Attending: Obstetrics and Gynecology | Admitting: Obstetrics and Gynecology

## 2016-07-18 DIAGNOSIS — Z01411 Encounter for gynecological examination (general) (routine) with abnormal findings: Secondary | ICD-10-CM | POA: Insufficient documentation

## 2016-07-18 DIAGNOSIS — Z1151 Encounter for screening for human papillomavirus (HPV): Secondary | ICD-10-CM | POA: Diagnosis present

## 2016-07-23 LAB — CYTOLOGY - PAP
Diagnosis: NEGATIVE
HPV (WINDOPATH): NOT DETECTED

## 2016-07-25 ENCOUNTER — Ambulatory Visit
Admission: RE | Admit: 2016-07-25 | Discharge: 2016-07-25 | Disposition: A | Discharge status: Home / Self Care | Payer: BLUE CROSS/BLUE SHIELD | Source: Ambulatory Visit | Attending: Family Medicine | Admitting: Family Medicine

## 2016-07-25 DIAGNOSIS — R928 Other abnormal and inconclusive findings on diagnostic imaging of breast: Secondary | ICD-10-CM

## 2016-08-15 ENCOUNTER — Other Ambulatory Visit: Payer: Self-pay | Admitting: Family Medicine

## 2016-08-15 DIAGNOSIS — E010 Iodine-deficiency related diffuse (endemic) goiter: Secondary | ICD-10-CM

## 2016-08-22 ENCOUNTER — Ambulatory Visit
Admission: RE | Admit: 2016-08-22 | Discharge: 2016-08-22 | Disposition: A | Discharge status: Home / Self Care | Payer: BLUE CROSS/BLUE SHIELD | Source: Ambulatory Visit | Attending: Family Medicine | Admitting: Family Medicine

## 2016-08-22 DIAGNOSIS — E010 Iodine-deficiency related diffuse (endemic) goiter: Secondary | ICD-10-CM

## 2017-06-14 IMAGING — US US THYROID
1 series · 13 of 25 positions shown · non-contrast
Comparison: None.

CLINICAL DATA: Goiter.  Thyromegaly

EXAM:
THYROID ULTRASOUND
TECHNIQUE: Ultrasound examination of the thyroid gland and adjacent soft
tissues was performed.

[Series 1: us thyroid · 0.08mm/px · 13 of 56 slices shown]
[im 1/56]
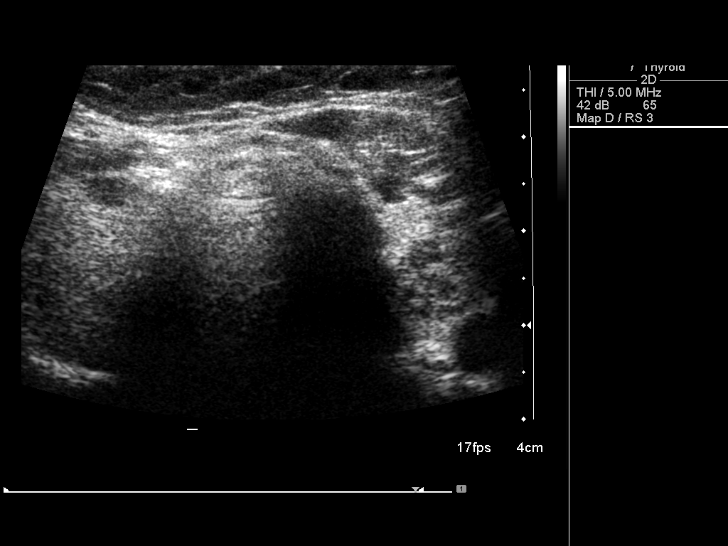
[im 5/56]
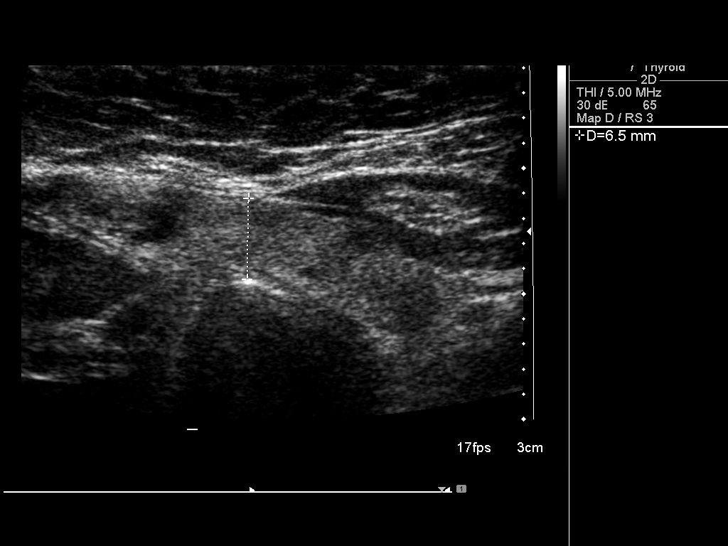
[im 10/56]
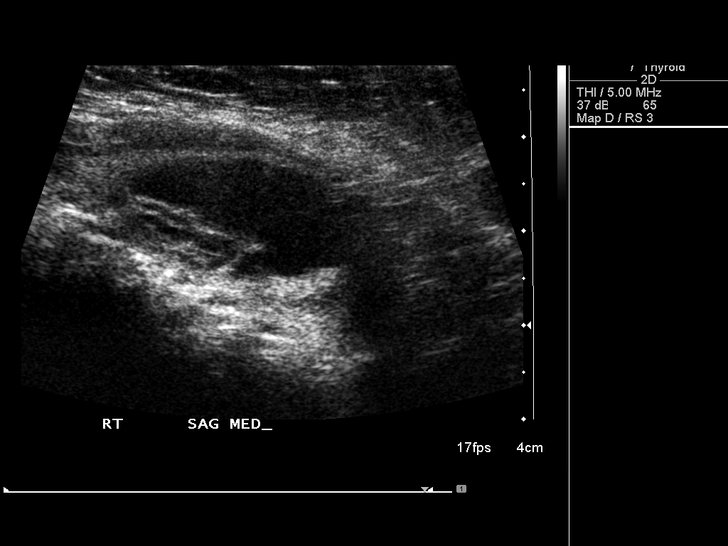
[im 14/56]
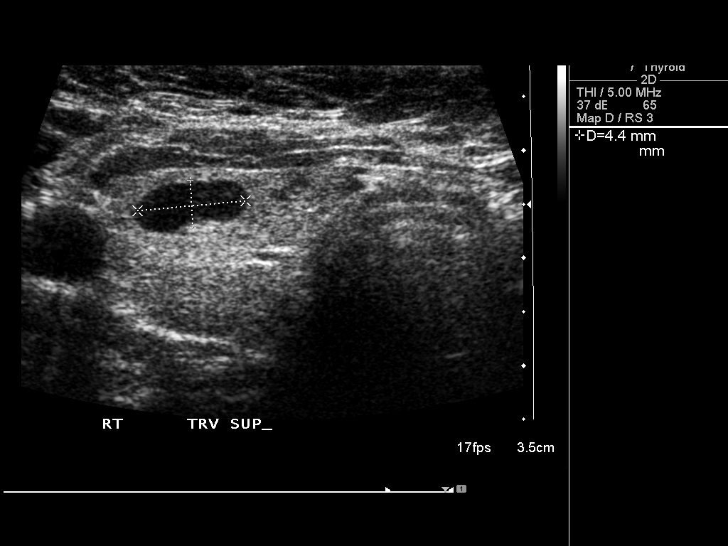
[im 19/56]
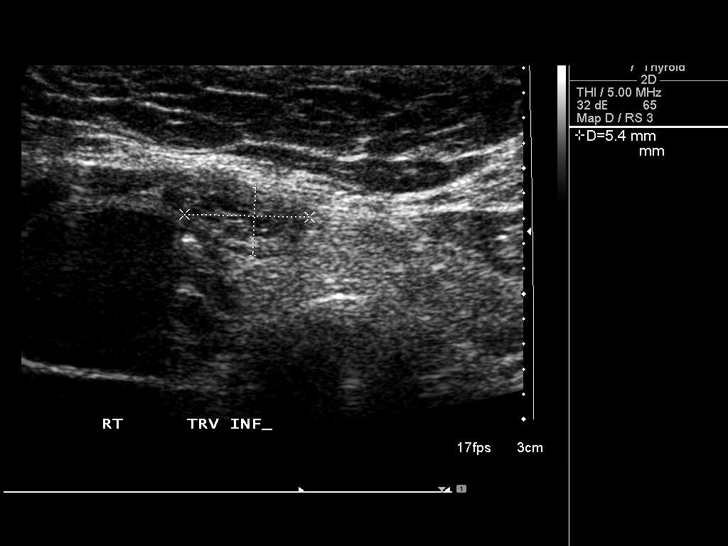
[im 23/56]
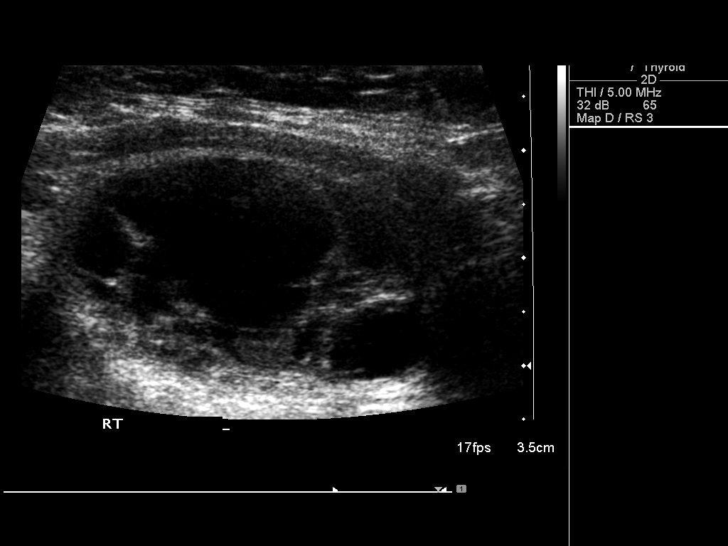
[im 28/56]
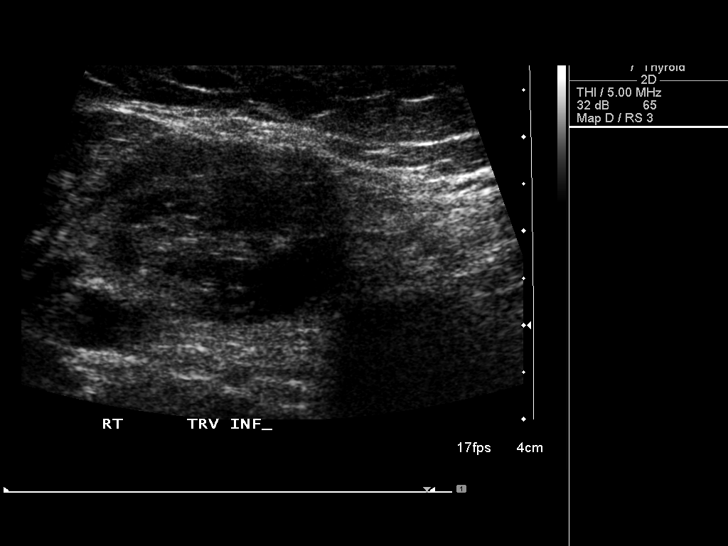
[im 33/56]
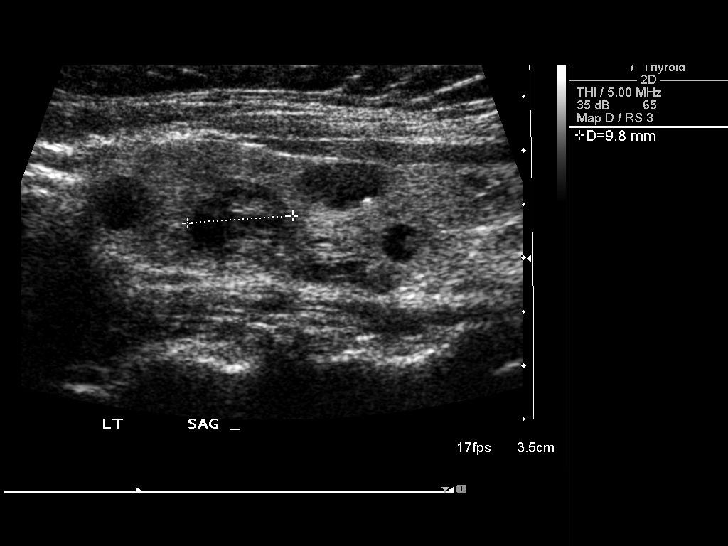
[im 37/56]
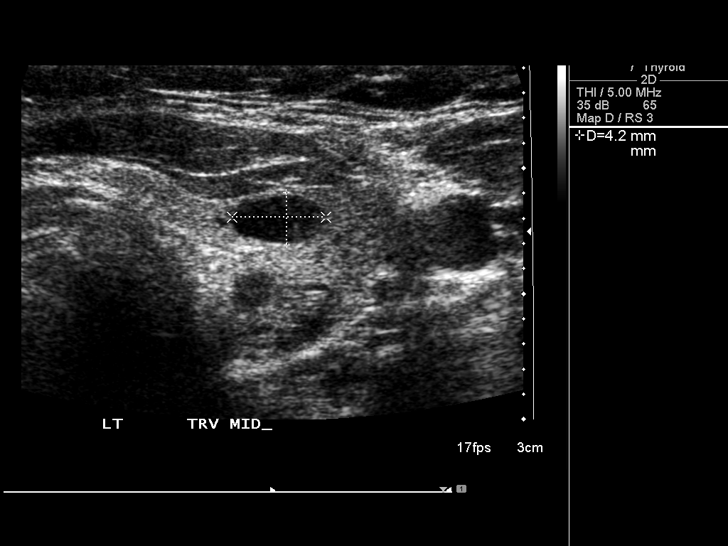
[im 42/56]
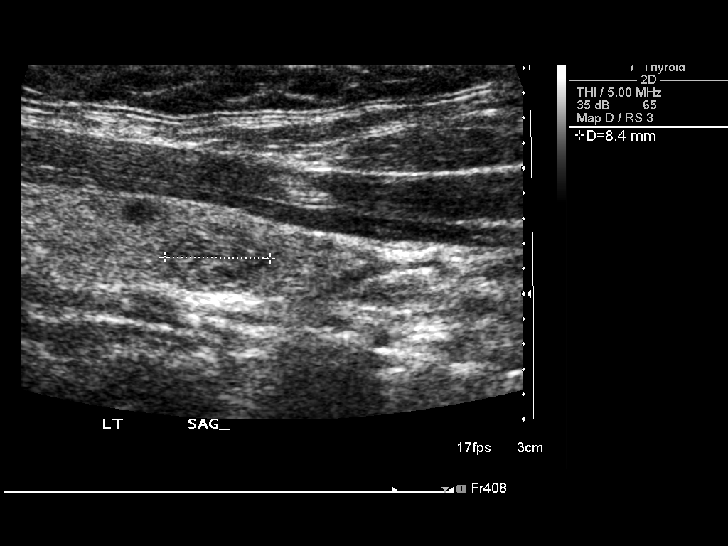
[im 46/56]
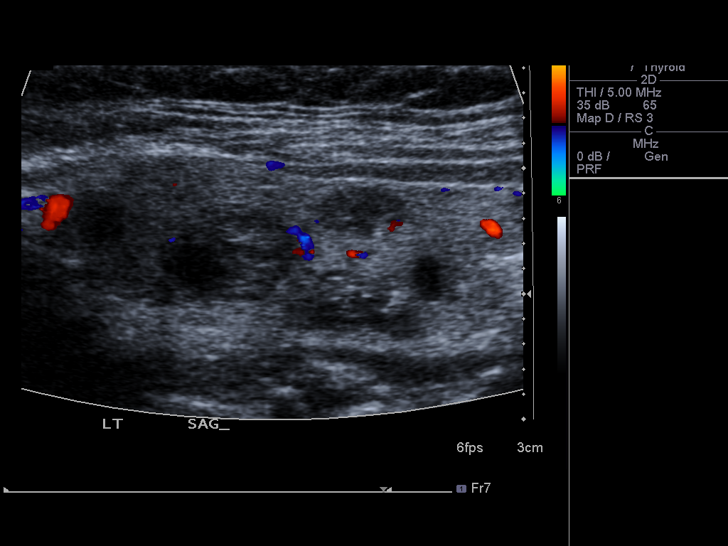
[im 51/56]
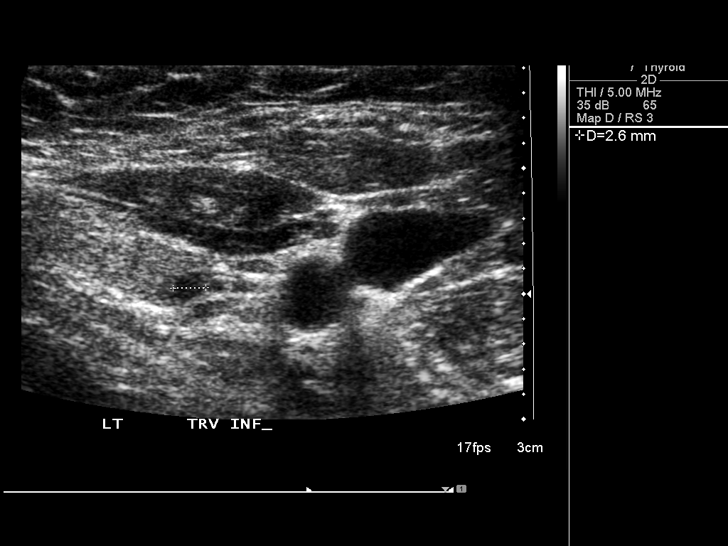
[im 56/56]
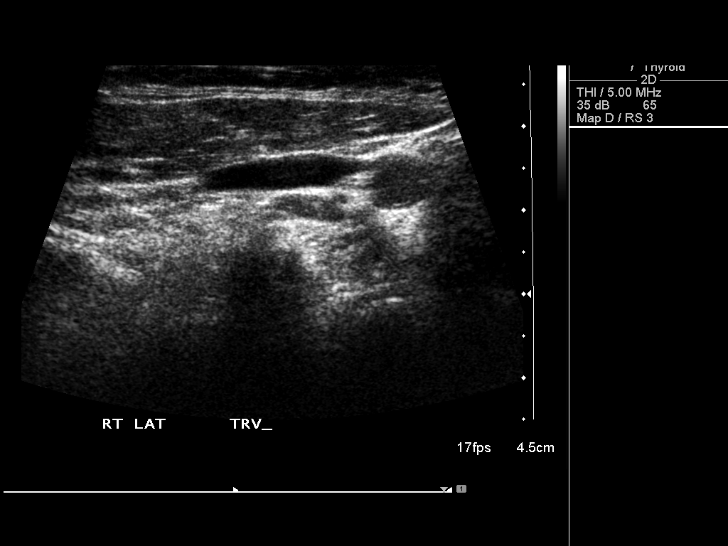

[13 of 25 positions shown; findings below may reference images not displayed]

FINDINGS: Parenchymal Echotexture: Mildly heterogenous

Estimated total number of nodules >/= 1 cm: 4

Number of spongiform nodules >/=  2 cm not described below (TR1): 0

Number of mixed cystic and solid nodules >/= 1.5 cm not described
below (TR2): 0

_________________________________________________________

Isthmus: 0.7 cm

Small hypoechoic nodule measuring 0.8 cm has a benign appearance.

_________________________________________________________

Right lobe: 7.6 x 2.6 x 3.1 cm

Nodule # 1:

Location: Right; Inferior

Size: 3.8 x 2.5 x 2.9 cm

Composition: mixed cystic and solid (1)

Echogenicity: isoechoic (1)

Shape: not taller-than-wide (0)

Margins: smooth (0)

Echogenic foci: none (0)

ACR TI-RADS total points: 2.

ACR TI-RADS risk category: TR2 (2 points).

ACR TI-RADS recommendations:

This nodule does NOT meet TI-RADS criteria for biopsy or dedicated
follow-up.

Nodule # 2:

Location: Right; Inferior

Size: 1.3 x 0.5 x 1.0 cm

Composition: solid/almost completely solid (2)

Echogenicity: isoechoic (1)

Shape: not taller-than-wide (0)

Margins: smooth (0)

Echogenic foci: none (0)

ACR TI-RADS total points: 3.

ACR TI-RADS risk category: TR3 (3 points).

ACR TI-RADS recommendations:

Given size (<1.4 cm) and appearance, this nodule does NOT meet
TI-RADS criteria for biopsy or dedicated follow-up.

Other smaller nodules are noted. They all measure less than 1 cm.
There is a 1 cm cyst in the upper pole which has a benign
appearance.

_________________________________________________________

Left lobe: 6.2 x 2.2 x 1.8 cm

Nodule # 3:

Location: Left; Superior

Size: 1.0 x 0.7 x 0.9 cm

Composition: solid/almost completely solid (2)

Echogenicity: isoechoic (1)

Shape: not taller-than-wide (0)

Margins: smooth (0)

Echogenic foci: none (0)

ACR TI-RADS total points: 3.

ACR TI-RADS risk category: TR3 (3 points).

ACR TI-RADS recommendations:

Given size (<1.4 cm) and appearance, this nodule does NOT meet
TI-RADS criteria for biopsy or dedicated follow-up.

Multiple other smaller similar-appearing nodules are noted.
IMPRESSION: Multiple bilateral and isthmic nodules are noted. None meet criteria
for fine needle aspiration biopsy or annual follow-up.

The above is in keeping with the ACR TI-RADS recommendations - [HOSPITAL] 5773;[DATE].

## 2017-09-10 ENCOUNTER — Other Ambulatory Visit: Payer: Self-pay | Admitting: Family Medicine

## 2017-09-10 DIAGNOSIS — Z1231 Encounter for screening mammogram for malignant neoplasm of breast: Secondary | ICD-10-CM

## 2017-09-17 ENCOUNTER — Other Ambulatory Visit: Payer: Self-pay | Admitting: Family Medicine

## 2017-09-17 DIAGNOSIS — E049 Nontoxic goiter, unspecified: Secondary | ICD-10-CM

## 2017-09-30 ENCOUNTER — Ambulatory Visit
Admission: RE | Admit: 2017-09-30 | Discharge: 2017-09-30 | Disposition: A | Discharge status: Home / Self Care | Payer: BLUE CROSS/BLUE SHIELD | Source: Ambulatory Visit | Attending: Family Medicine | Admitting: Family Medicine

## 2017-09-30 DIAGNOSIS — E049 Nontoxic goiter, unspecified: Secondary | ICD-10-CM

## 2017-10-02 ENCOUNTER — Ambulatory Visit
Admission: RE | Admit: 2017-10-02 | Discharge: 2017-10-02 | Disposition: A | Discharge status: Home / Self Care | Payer: BLUE CROSS/BLUE SHIELD | Source: Ambulatory Visit | Attending: Family Medicine | Admitting: Family Medicine

## 2017-10-02 DIAGNOSIS — Z1231 Encounter for screening mammogram for malignant neoplasm of breast: Secondary | ICD-10-CM

## 2022-10-17 ENCOUNTER — Encounter: Payer: Self-pay | Admitting: Internal Medicine

## 2022-10-17 ENCOUNTER — Ambulatory Visit: Payer: 59 | Admitting: Internal Medicine

## 2022-10-17 ENCOUNTER — Telehealth: Payer: Self-pay | Admitting: Internal Medicine

## 2022-10-17 VITALS — BP 138/78 | HR 84 | Temp 97.6°F | Resp 18 | Ht 63.5 in | Wt 203.5 lb

## 2022-10-17 DIAGNOSIS — J310 Chronic rhinitis: Secondary | ICD-10-CM | POA: Diagnosis not present

## 2022-10-17 DIAGNOSIS — R053 Chronic cough: Secondary | ICD-10-CM | POA: Diagnosis not present

## 2022-10-17 DIAGNOSIS — K2100 Gastro-esophageal reflux disease with esophagitis, without bleeding: Secondary | ICD-10-CM

## 2022-10-17 DIAGNOSIS — J4520 Mild intermittent asthma, uncomplicated: Secondary | ICD-10-CM

## 2022-10-17 MED ORDER — ALBUTEROL SULFATE HFA 108 (90 BASE) MCG/ACT IN AERS: 2.0000 | INHALATION_SPRAY | Freq: Four times a day (QID) | RESPIRATORY_TRACT | 1 refills | Status: DC | PRN

## 2022-10-17 MED ORDER — FAMOTIDINE 20 MG PO TABS: 20.0000 mg | ORAL_TABLET | Freq: Two times a day (BID) | ORAL | 5 refills | Status: DC | PRN

## 2022-10-17 MED ORDER — FLUTICASONE PROPIONATE 50 MCG/ACT NA SUSP: 2.0000 | Freq: Every day | NASAL | 5 refills | Status: DC

## 2022-10-17 NOTE — Progress Notes (Signed)
NEW PATIENT  Date of Service/Encounter:  10/17/22  Consult requested by: Jonathon Jordan, MD   Subjective:   Rhonda Walker (DOB: February 17, 1961) is a 62 y.o. female who presents to the clinic on 10/17/2022 with a chief complaint of Cough, Allergic Rhinitis , Breathing Problem, Other, and Pain .    History obtained from: chart review and patient.   Asthma:  Diagnosed at age 27 and used to follow with [place] but then was lost to follow up during [place].  She also reports having COVID and having worsening in her asthma symptoms since then.  Coughing and chest tightness almost weekly for the past few years intermittently  About weekly daytime symptoms in past month, 0 nighttime awakenings in past month Using rescue inhaler: none, she has ran out Limitations to daily activity: mild 1 ED visits/UC visits and 0 oral steroids in the past year 0 number of lifetime hospitalizations, 0 number of lifetime intubations.  Identified Triggers: exercise and respiratory illness Prior PFTs or spirometry: done but not available for review Current regimen:  Maintenance: none Rescue: Albuterol 2 puffs q4-6 hrs PRN  Rhinitis:  Started years ago.  Symptoms include: nasal congestion, rhinorrhea, post nasal drainage, watery eyes, and itchy eyes  Occurs year-round Potential triggers: not sure Treatments tried:  Saline rinses Mucinex PRN Benadryl   Previous allergy testing: yes years ago History of sinus surgery: no Nonallergic triggers: none   GERD:  Does report having some trouble with heartburn depending on what she eats.  Symptoms are not every day.  This has also improved with Prilosec 20 mg daily.   Past Medical History: Past Medical History:  Diagnosis Date   Asthma    related to mold allergy   Chronic kidney disease    normal ranges currently- mild stage 2 from NSAIDS use   Headache    history of migraines   Hypertension    RhD negative    Past Surgical History: Past Surgical History:   Procedure Laterality Date   COLONOSCOPY     x 3   DILATATION & CURETTAGE/HYSTEROSCOPY WITH MYOSURE N/A 04/13/2015   Procedure: DILATATION & CURETTAGE/HYSTEROSCOPY WITH MYOSURE;  Surgeon: Thurnell Lose, MD;  Location: [facility];  Service: Gynecology;  Laterality: N/A;    Family History: Family History  Problem Relation Age of Onset   Emphysema Father    Allergic rhinitis Sister    Migraines Sister     Social History:  Lives in a 37 year house Flooring in bedroom: carpet Pets: dog Tobacco use/exposure: none Job: Therapist, sports  Medication List:  Allergies as of 10/17/2022   No Known Allergies      Medication List        Accurate as of October 17, 2022  9:49 AM. If you have any questions, ask your nurse or doctor.          STOP taking these medications    albuterol 108 (90 Base) MCG/ACT inhaler Commonly known as: VENTOLIN HFA Stopped by: Larose Kells, MD   beclomethasone 40 MCG/ACT inhaler Commonly known as: Qvar Stopped by: Larose Kells, MD   ibuprofen 600 MG tablet Commonly known as: ADVIL Stopped by: Larose Kells, MD   labetalol 300 MG tablet Commonly known as: NORMODYNE Stopped by: Larose Kells, MD   medroxyPROGESTERone 10 MG tablet Commonly known as: PROVERA Stopped by: Larose Kells, MD       TAKE these medications    Azelastine HCl 0.15 % Soln  1 drop in each nostril What changed: Another medication with the same name was removed. Continue taking this medication, and follow the directions you see here. Changed by: Larose Kells, MD   carvedilol 25 MG tablet Commonly known as: COREG Take by mouth.   Multivitamin Adult Tabs Take 1 tablet by mouth daily.   omeprazole 20 MG capsule Commonly known as: PRILOSEC Take 20 mg by mouth daily.   spironolactone 50 MG tablet Commonly known as: ALDACTONE Take 50 mg by mouth 2 (two) times daily.         REVIEW OF SYSTEMS: Pertinent positives and negatives discussed in HPI.    Objective:   Physical Exam: BP 138/78   Pulse 84   Temp 97.6 F (36.4 C) (Temporal)   Resp 18   Ht 5' 3.5" (1.613 m)   Wt 203 lb 8 oz (92.3 kg)   SpO2 98%   BMI 35.48 kg/m  Body mass index is 35.48 kg/m. GEN: alert, well developed HEENT: clear conjunctiva, nose with + inferior turbinate hypertrophy, pink nasal mucosa, slight clear rhinorrhea, + cobblestoning HEART: regular rate and rhythm, no murmur LUNGS: clear to auscultation bilaterally, no coughing, unlabored respiration ABDOMEN: soft, non distended  SKIN: no rashes or lesions  Reviewed:  09/12/2022: Seen by PA Rayburn Go for GERD/esophagitis controlled on omeprazole 20 mg daily.  CT chest showed wall thickening of the distal esophagus concerning for esophagitis.  09/08/2022: Seen in ER for chest pain, had an unremarkable troponin and chest x-ray.  They did a CT PE scan which was concerning for esophagitis so was started on Prilosec  12/04/2021: Seen by PA Rayburn Go from a persistent asthma that was stable on as needed albuterol.  Spirometry:  Tracings reviewed. Her effort: Good reproducible efforts. FVC: 2.63L FEV1: 2.06L, 85% predicted FEV1/FVC ratio: 78% Interpretation: Spirometry consistent with normal pattern.  Please see scanned spirometry results for details.   Assessment:   1. Mild intermittent asthma without complication   2. Chronic rhinitis   3. Gastroesophageal reflux disease with esophagitis without hemorrhage     Plan/Recommendations:  Chronic Rhinitis: - Due to turbinate hypertrophy, seasonal symptoms and unresponsive to OTC meds, plan to perform skin testing to identify aeroallergen triggers.   - Use nasal saline rinses before nose sprays such as with Neilmed Sinus Rinse.  Use distilled water.   - Use Flonase 2 sprays each nostril daily. Aim upward and outward. - Hold all anti-histamines 3 days prior to next visit.  Will do skin testing 1-59 at the time.    Mild Intermittent Asthma: - Spirometry  today was normal she has rare weekly symptoms so will keep her on albuterol as needed - Maintenance inhaler: none - Rescue inhaler: Albuterol 2 puffs via spacer or 1 vial via nebulizer every 4-6 hours as needed for respiratory symptoms of cough, shortness of breath, or wheezing Asthma control goals:  Full participation in all desired activities (may need albuterol before activity) Albuterol use two times or less a week on average (not counting use with activity) Cough interfering with sleep two times or less a month Oral steroids no more than once a year No hospitalizations  GERD with Esophagitis - Continue Prilosec '20mg'$  daily. - Use Pepcid '20mg'$  twice daily if needed for reflux.  - If symptoms persist, will refer to GI for further evaluation and possible EGD -Avoid lying down for at least two hours after a meal or after drinking acidic beverages, like soda, or other caffeinated beverages. This can help to  prevent stomach contents from flowing back into the esophagus. -Keep your head elevated while you sleep. Using an extra pillow or two can also help to prevent reflux. -Eat smaller and more frequent meals each day instead of a few large meals. This promotes digestion and can aid in preventing heartburn. -Wear loose-fitting clothes to ease pressure on the stomach, which can worsen heartburn and reflux. -Reduce excess weight around the midsection. This can ease pressure on the stomach. Such pressure can force some stomach contents back up the esophagus.      Return in about 1 week (around 10/24/2022).  Harlon Flor, MD [facility]

## 2022-10-17 NOTE — Telephone Encounter (Signed)
Pt states [facility] is having technical issues, they need prescriptions called in.

## 2022-10-17 NOTE — Telephone Encounter (Signed)
[facility] to [facility] they already filled her rx's and nothing else for usto do informed pt she stated understanding

## 2022-10-17 NOTE — Patient Instructions (Addendum)
Follow up in 1 week for skin testing.  Overbook for AM with me if needed.    Chronic Rhinitis: - Due to turbinate hypertrophy, seasonal symptoms and unresponsive to OTC meds, plan to perform skin testing to identify aeroallergen triggers.   - Use nasal saline rinses before nose sprays such as with Neilmed Sinus Rinse.  Use distilled water.   - Use Flonase 2 sprays each nostril daily. Aim upward and outward. - Hold all anti-histamines 3 days prior to next visit.  Will do skin testing 1-59 at the time.    Mild Intermittent Asthma: - MDI technique discussed.   - Maintenance inhaler: none - Rescue inhaler: Albuterol 2 puffs via spacer or 1 vial via nebulizer every 4-6 hours as needed for respiratory symptoms of cough, shortness of breath, or wheezing Asthma control goals:  Full participation in all desired activities (may need albuterol before activity) Albuterol use two times or less a week on average (not counting use with activity) Cough interfering with sleep two times or less a month Oral steroids no more than once a year No hospitalizations  GERD - Continue Prilosec '20mg'$  daily. - Use Pepcid '20mg'$  twice daily if needed for reflux.  -Avoid lying down for at least two hours after a meal or after drinking acidic beverages, like soda, or other caffeinated beverages. This can help to prevent stomach contents from flowing back into the esophagus. -Keep your head elevated while you sleep. Using an extra pillow or two can also help to prevent reflux. -Eat smaller and more frequent meals each day instead of a few large meals. This promotes digestion and can aid in preventing heartburn. -Wear loose-fitting clothes to ease pressure on the stomach, which can worsen heartburn and reflux. -Reduce excess weight around the midsection. This can ease pressure on the stomach. Such pressure can force some stomach contents back up the esophagus.

## 2022-10-22 ENCOUNTER — Telehealth: Payer: Self-pay

## 2022-10-22 ENCOUNTER — Other Ambulatory Visit (HOSPITAL_COMMUNITY): Payer: Self-pay

## 2022-10-22 NOTE — Telephone Encounter (Signed)
Patient Advocate Encounter   Received notification from [employer] that prior authorization is required for Famotidine '20MG'$  tablets  Submitted: 10-22-2022 Key [id number]   Status is pending

## 2022-10-23 NOTE — Telephone Encounter (Signed)
Will pt have to purchase otc?

## 2022-10-23 NOTE — Telephone Encounter (Signed)
Patient Advocate Encounter  Received a fax from [employer] regarding Prior Authorization for Famotidine '20MG'$  tablets.   Authorization has been DENIED due to

## 2022-10-24 ENCOUNTER — Ambulatory Visit: Payer: 59 | Admitting: Internal Medicine

## 2022-10-24 ENCOUNTER — Other Ambulatory Visit: Payer: Self-pay

## 2022-10-24 VITALS — BP 132/68 | HR 74 | Temp 97.1°F | Resp 18

## 2022-10-24 DIAGNOSIS — J305 Allergic rhinitis due to food: Secondary | ICD-10-CM

## 2022-10-24 DIAGNOSIS — J3089 Other allergic rhinitis: Secondary | ICD-10-CM | POA: Diagnosis not present

## 2022-10-24 MED ORDER — CETIRIZINE HCL 10 MG PO TABS: 10.0000 mg | ORAL_TABLET | Freq: Every day | ORAL | 5 refills | Status: DC

## 2022-10-24 MED ORDER — AZELASTINE HCL 0.15 % NA SOLN: 1.0000 | Freq: Two times a day (BID) | NASAL | 5 refills | Status: AC

## 2022-10-24 MED ORDER — AZELASTINE HCL 0.1 % NA SOLN: 2.0000 | Freq: Two times a day (BID) | NASAL | 5 refills | Status: DC

## 2022-10-24 MED ORDER — OLOPATADINE HCL 0.2 % OP SOLN: 1.0000 [drp] | Freq: Every day | OPHTHALMIC | 5 refills | Status: DC | PRN

## 2022-10-24 NOTE — Telephone Encounter (Signed)
Pt informed and stated understanding

## 2022-10-24 NOTE — Patient Instructions (Addendum)
Return in about 6 weeks (around 12/05/2022).  Rhonda Walker  Allergic Rhinoconjunctivitis  - Positive skin test 10/2022: mold - Avoidance measures discussed. - Use nasal saline rinses before nose sprays such as with Neilmed Sinus Rinse.  Use distilled water.   - Use Flonase 2 sprays each nostril daily. Aim upward and outward. - Use Azelastine 1-2 sprays each nostril twice daily as needed. Aim upward and outward. - Use Zyrtec 10 mg daily.  - For eyes, use Olopatadine or Ketotifen 1 eye drop daily as needed for itchy, watery eyes.  Available over the counter, if not covered by insurance.  - Consider allergy shots as long term control of your symptoms by teaching your immune system to be more tolerant of your allergy triggers  Reaction to Shellfish - SPT 10/2022 today was negative to shellfish. Based on history and testing, do not believe your symptoms were related to shellfish ingestion. Okay to consume shellfish.  Mild Intermittent Asthma: - MDI technique discussed.   - Maintenance inhaler: none - Rescue inhaler: Albuterol 2 puffs via spacer or 1 vial via nebulizer every 4-6 hours as needed for respiratory symptoms of cough, shortness of breath, or wheezing Asthma control goals:  Full participation in all desired activities (may need albuterol before activity) Albuterol use two times or less a week on average (not counting use with activity) Cough interfering with sleep two times or less a month Oral steroids no more than once a year No hospitalizations  GERD - Continue Prilosec '20mg'$  daily. - Use Pepcid '20mg'$  twice daily if needed for reflux.  -Avoid lying down for at least two hours after a meal or after drinking acidic beverages, like soda, or other caffeinated beverages. This can help to prevent stomach contents from flowing back into the esophagus. -Keep your head elevated while you sleep. Using an extra pillow or two can also help to prevent reflux. -Eat smaller and more frequent  meals each day instead of a few large meals. This promotes digestion and can aid in preventing heartburn. -Wear loose-fitting clothes to ease pressure on the stomach, which can worsen heartburn and reflux. -Reduce excess weight around the midsection. This can ease pressure on the stomach. Such pressure can force some stomach contents back up the esophagus.    ALLERGEN AVOIDANCE MEASURES  Molds - Indoor avoidance Use air conditioning to reduce indoor humidity.  Do not use a humidifier. Keep indoor humidity at 30 - 40%.  Use a dehumidifier if needed. In the bathroom use an exhaust fan or open a window after showering.  Wipe down damp surfaces after showering.  Clean bathrooms with a mold-killing solution (diluted bleach, or products like Tilex, etc) at least once a month. In the kitchen use an exhaust fan to remove steam from cooking.  Throw away spoiled foods immediately, and empty garbage daily.  Empty water pans below self-defrosting refrigerators frequently. Vent the clothes dryer to the outside. Limit indoor houseplants; mold grows in the dirt.  No houseplants in the bedroom. Remove carpet from the bedroom. Encase the mattress and box springs with a zippered encasing.  Molds - Outdoor avoidance Avoid being outside when the grass is being mowed, or the ground is tilled. Avoid playing in leaves, pine straw, hay, etc.  Dead plant materials contain mold. Avoid going into barns or grain storage areas. Remove leaves, clippings and compost from around the home.

## 2022-10-24 NOTE — Progress Notes (Signed)
FOLLOW UP Date of Service/Encounter:  10/24/22   Subjective:  Rhonda Walker (DOB: 1960/11/14) is a 62 y.o. female who returns to the [facility] on 10/24/2022 for follow up for skin testing.   History obtained from: chart review and patient.  Reports having to use her albuterol a couple of times this week but prior to that was doing well.  She thinks her symptoms are more triggered due to uncontrolled nasal congestion and drainage which results in wheezing and shortness of breath.  Prior to that was not requiring albuterol much.    Holding anti histamines for the test today.  Uses nasal rinses and Flonase daily.  Previously on PRN benadryl.    She wanted to be tested for shellfish also because once she ate shellfish and then next day had increased congestion, drainage, shortness of breath and felt wheezy.  She still consumes shellfish and hasn't had that reoccur.     Past Medical History: Past Medical History:  Diagnosis Date   Asthma    related to mold allergy   Chronic kidney disease    normal ranges currently- mild stage 2 from NSAIDS use   Headache    history of migraines   Hypertension    RhD negative     Objective:  BP 132/68   Pulse 74   Temp (!) 97.1 F (36.2 C) (Temporal)   Resp 18   SpO2 98%  There is no height or weight on file to calculate BMI. Physical Exam: GEN: alert, well developed HEENT: clear conjunctiva, MMM HEART: regular rate and rhythm, no murmur LUNGS: clear to auscultation bilaterally, no coughing, unlabored respiration SKIN: no rashes or lesions  Skin Testing:  Skin prick testing was placed, which includes aeroallergens/foods, histamine control, and saline control.  Verbal consent was obtained prior to placing test.  Patient tolerated procedure well.  Allergy testing results were read and interpreted by myself, documented by clinical staff. Adequate positive and negative control.  Positive results to:  Results discussed with  patient/family.  Airborne Adult Perc - 10/24/22 1004     Time Antigen Placed 1004    Allergen Manufacturer Lavella Hammock    Location Back    Number of Test 59    1. Control-Buffer 50% Glycerol Negative    2. Control-Histamine 1 mg/ml 3+    3. Albumin saline Negative    4. Chaparrito Negative    5. [place] Negative    6. Johnson Negative    7. Primera Blue Negative    8. Meadow Fescue Negative    9. Perennial Rye Negative    10. Sweet Vernal Negative    11. Timothy Negative    12. Cocklebur Negative    13. Burweed Marshelder Negative    14. Ragweed, short Negative    15. Ragweed, Giant Negative    16. Plantain,  English Negative    17. Lamb's Quarters Negative    18. Sheep Sorrell Negative    19. Rough Pigweed Negative    20. Marsh Elder, Rough Negative    21. Mugwort, Common Negative    22. Ash mix Negative    23. Birch mix Negative    24. Beech American Negative    25. Box, Elder Negative    26. Cedar, red Negative    27. Cottonwood, [place] Negative    28. Elm mix Negative    29. Hickory Negative    30. Maple mix Negative    31. Oak, [place] mix Negative  32. Pecan Pollen Negative    33. Pine mix Negative    34. Sycamore Eastern Negative    35. Candler-McAfee, Black Pollen Negative    36. Alternaria alternata Negative    37. Cladosporium Herbarum Negative    38. Aspergillus mix Negative    39. Penicillium mix Negative    40. Bipolaris sorokiniana (Helminthosporium) Negative    41. Drechslera spicifera (Curvularia) Negative    42. Mucor plumbeus Negative    43. Fusarium moniliforme Negative    44. Aureobasidium pullulans (pullulara) Negative    45. Rhizopus oryzae Negative    46. Botrytis cinera Negative    47. Epicoccum nigrum Negative    48. Phoma betae Negative    49. Candida Albicans Negative    50. Trichophyton mentagrophytes Negative    51. Mite, D Farinae  5,000 AU/ml Negative    52. Mite, D Pteronyssinus  5,000 AU/ml Negative    53. Cat Hair 10,000 BAU/ml Negative     54.  Dog Epithelia Negative    55. Mixed Feathers Negative    56. Horse Epithelia Negative    57. Cockroach, German Negative    58. Mouse Negative    59. Tobacco Leaf Negative             Intradermal - 10/24/22 1051     Time Antigen Placed 1045    Location Arm    Number of Test 15    Intradermal Select    Control Negative    Guatemala Negative    Johnson Negative    7 Grass Negative    Ragweed mix Negative    Weed mix Negative    Tree mix Negative    Mold 1 Negative    Mold 2 3+    Mold 3 Negative    Mold 4 3+    Cat Negative    Dog Negative    Cockroach Negative    Mite mix Negative    Other Negative             Food Adult Perc - 10/24/22 1000     Time Antigen Placed 1004    Allergen Manufacturer Greer    Location Back    Number of allergen test 5    25. Shrimp Negative    26. Crab Negative    27. Lobster Negative    28. Oyster Negative    29. Scallops Negative              Assessment:   1. Perennial allergic rhinitis   2. Allergic rhinitis due to food     Plan/Recommendations:  Allergic Rhinoconjunctivitis  - Due to turbinate hypertrophy, seasonal symptoms and unresponsive to OTC meds, performed skin testing to identify aeroallergen triggers.   - Positive skin test 10/2022: mold - Avoidance measures discussed. - Use nasal saline rinses before nose sprays such as with Neilmed Sinus Rinse.  Use distilled water.   - Use Flonase 2 sprays each nostril daily. Aim upward and outward. - Use Azelastine 1-2 sprays each nostril twice daily as needed. Aim upward and outward. - Use Zyrtec 10 mg daily.  - For eyes, use Olopatadine or Ketotifen 1 eye drop daily as needed for itchy, watery eyes.  Available over the counter, if not covered by insurance.  - Consider allergy shots as long term control of your symptoms by teaching your immune system to be more tolerant of your allergy triggers  Reaction to Shellfish - SPT 10/2022 today was negative to  shellfish.  Based on history and testing, do not believe your symptoms were related to shellfish ingestion. Okay to consume shellfish.  Mild Intermittent Asthma: - MDI technique discussed.   - Maintenance inhaler: none - Rescue inhaler: Albuterol 2 puffs via spacer or 1 vial via nebulizer every 4-6 hours as needed for respiratory symptoms of cough, shortness of breath, or wheezing Asthma control goals:  Full participation in all desired activities (may need albuterol before activity) Albuterol use two times or less a week on average (not counting use with activity) Cough interfering with sleep two times or less a month Oral steroids no more than once a year No hospitalizations  GERD - Continue Prilosec '20mg'$  daily. - Use Pepcid '20mg'$  twice daily if needed for reflux.  -Avoid lying down for at least two hours after a meal or after drinking acidic beverages, like soda, or other caffeinated beverages. This can help to prevent stomach contents from flowing back into the esophagus. -Keep your head elevated while you sleep. Using an extra pillow or two can also help to prevent reflux. -Eat smaller and more frequent meals each day instead of a few large meals. This promotes digestion and can aid in preventing heartburn. -Wear loose-fitting clothes to ease pressure on the stomach, which can worsen heartburn and reflux. -Reduce excess weight around the midsection. This can ease pressure on the stomach. Such pressure can force some stomach contents back up the esophagus.    ALLERGEN AVOIDANCE MEASURES  Molds - Indoor avoidance Use air conditioning to reduce indoor humidity.  Do not use a humidifier. Keep indoor humidity at 30 - 40%.  Use a dehumidifier if needed. In the bathroom use an exhaust fan or open a window after showering.  Wipe down damp surfaces after showering.  Clean bathrooms with a mold-killing solution (diluted bleach, or products like Tilex, etc) at least once a month. In the kitchen  use an exhaust fan to remove steam from cooking.  Throw away spoiled foods immediately, and empty garbage daily.  Empty water pans below self-defrosting refrigerators frequently. Vent the clothes dryer to the outside. Limit indoor houseplants; mold grows in the dirt.  No houseplants in the bedroom. Remove carpet from the bedroom. Encase the mattress and box springs with a zippered encasing.  Molds - Outdoor avoidance Avoid being outside when the grass is being mowed, or the ground is tilled. Avoid playing in leaves, pine straw, hay, etc.  Dead plant materials contain mold. Avoid going into barns or grain storage areas. Remove leaves, clippings and compost from around the home.    Return in about 6 weeks (around 12/05/2022).  Harlon Flor, MD [facility]

## 2022-10-26 ENCOUNTER — Telehealth: Payer: Self-pay

## 2022-10-26 NOTE — Telephone Encounter (Signed)
0.1% has been sent in in replace of the 0.15

## 2022-10-26 NOTE — Telephone Encounter (Signed)
PA request received via CMM for Azelastine HCl 0.15% solution  PA has been submitted to [employer] and is pending determination, patient has not "failed" any meds and is taking multiple for the same DX.  Key: [id number]

## 2022-10-26 NOTE — Telephone Encounter (Signed)
PA has been DENIED due to:   The requested medication and/or diagnosis are not a covered benefit and excluded from coverage in accordance with the terms and conditions of your plan benefit. Therefore, the request has been administratively denied.

## 2022-11-20 ENCOUNTER — Other Ambulatory Visit: Payer: Self-pay | Admitting: Internal Medicine

## 2022-12-05 ENCOUNTER — Other Ambulatory Visit: Payer: Self-pay

## 2022-12-05 ENCOUNTER — Encounter: Payer: Self-pay | Admitting: Internal Medicine

## 2022-12-05 ENCOUNTER — Ambulatory Visit: Payer: 59 | Admitting: Internal Medicine

## 2022-12-05 VITALS — BP 128/76 | HR 82 | Temp 98.0°F | Resp 18 | Wt 200.0 lb

## 2022-12-05 DIAGNOSIS — J3089 Other allergic rhinitis: Secondary | ICD-10-CM

## 2022-12-05 DIAGNOSIS — J4520 Mild intermittent asthma, uncomplicated: Secondary | ICD-10-CM | POA: Diagnosis not present

## 2022-12-05 DIAGNOSIS — K2100 Gastro-esophageal reflux disease with esophagitis, without bleeding: Secondary | ICD-10-CM | POA: Diagnosis not present

## 2022-12-05 MED ORDER — AZELASTINE HCL 0.1 % NA SOLN: 2.0000 | Freq: Two times a day (BID) | NASAL | 5 refills | Status: AC

## 2022-12-05 MED ORDER — CETIRIZINE HCL 10 MG PO TABS: 10.0000 mg | ORAL_TABLET | Freq: Every day | ORAL | 5 refills | Status: AC

## 2022-12-05 MED ORDER — OLOPATADINE HCL 0.2 % OP SOLN: 1.0000 [drp] | Freq: Every day | OPHTHALMIC | 5 refills | Status: AC | PRN

## 2022-12-05 MED ORDER — FAMOTIDINE 20 MG PO TABS: 20.0000 mg | ORAL_TABLET | Freq: Two times a day (BID) | ORAL | 5 refills | Status: AC | PRN

## 2022-12-05 MED ORDER — ALBUTEROL SULFATE HFA 108 (90 BASE) MCG/ACT IN AERS: 2.0000 | INHALATION_SPRAY | Freq: Four times a day (QID) | RESPIRATORY_TRACT | 1 refills | Status: AC | PRN

## 2022-12-05 MED ORDER — FLUTICASONE PROPIONATE 50 MCG/ACT NA SUSP: 2.0000 | Freq: Every day | NASAL | 5 refills | Status: AC

## 2022-12-05 NOTE — Progress Notes (Signed)
FOLLOW UP Date of Service/Encounter:  12/05/22   Subjective:  Rhonda Walker (DOB: 12-06-1960) is a 62 y.o. female who [facility] for follow up for allergic rhinitis, mild intermittent asthma and GERD.  History obtained from: chart review and patient. Last visit was 10/24/2022 for SPT and positive to mold; discussed starting Flonase/Azelastine/Zyrtec.  Also on PRN albuterol.  SPT was negative to shellfish and based on sxs, it was thought not to be an IgE mediated shellfish allergy so recommended reintroduction at home.  GERD on Pepcid/Prilosec.  Asthma: ACT 20/25  Doing a lot better since last visit.  Only needed her albuterol a few times since last visit with last use being 2 weeks ago and it was with activity.  Previously felt like she was "breathing through a straw" but with control of her upper airway symptoms, she is feeling better. No ER visits/oral prednisone.   Rhinitis: Doing much better with less congestion, drainage, runny nose. Not much ocular symptoms. Using Flonase and Zyrtec daily and uses the Azelastine PRN. Has not need the olopatadine much.   Shellfish Reaction: Has safely reintroduced shellfish.  She ate shrimp and had no trouble.  GERD: Doing better but still some reflux symptoms requiring Pepcid about twice a week.  Has stopped the prilosec and feels like pepcid works better for her.   Past Medical History: Past Medical History:  Diagnosis Date   Asthma    related to mold allergy   Chronic kidney disease    normal ranges currently- mild stage 2 from NSAIDS use   Headache    history of migraines   Hypertension    RhD negative     Objective:  BP 128/76 (BP Location: Left Arm, Patient Position: Sitting, Cuff Size: Normal)   Pulse 82   Temp 98 F (36.7 C) (Oral)   Resp 18   Wt 200 lb (90.7 kg)   SpO2 99%   BMI 34.87 kg/m  Body mass index is 34.87 kg/m. Physical Exam: GEN: alert, well developed HEENT: clear  conjunctiva, nose with moderate inferior turbinate hypertrophy, pink nasal mucosa, no rhinorrhea, no cobblestoning HEART: regular rate and rhythm, no murmur LUNGS: clear to auscultation bilaterally, no coughing, unlabored respiration SKIN: no rashes or lesions   Assessment:   1. Mild intermittent asthma without complication   2. Perennial allergic rhinitis   3. Gastroesophageal reflux disease with esophagitis without hemorrhage     Plan/Recommendations:  Allergic Rhinoconjunctivitis  - Improved with use of combination of INCS/INAH/OAH.   - Positive skin test 10/2022: mold - Avoidance measures discussed. - Use nasal saline rinses before nose sprays such as with Neilmed Sinus Rinse.  Use distilled water.   - Use Flonase 2 sprays each nostril daily. Aim upward and outward. - Use Azelastine 1-2 sprays each nostril twice daily as needed. Aim upward and outward. - Use Zyrtec 10 mg daily.  - For eyes, use Olopatadine or Ketotifen 1 eye drop daily as needed for itchy, watery eyes.  Available over the counter, if not covered by insurance.  - Consider allergy shots as long term control of your symptoms by teaching your immune system to be more tolerant of your allergy triggers  Mild Intermittent Asthma: - Controlled - MDI technique discussed.   - Maintenance inhaler: none - Rescue inhaler: Albuterol 2 puffs via spacer or 1 vial via nebulizer every 4-6 hours as needed for respiratory symptoms of cough, shortness of breath, or wheezing Asthma control  goals:  Full participation in all desired activities (may need albuterol before activity) Albuterol use two times or less a week on average (not counting use with activity) Cough interfering with sleep two times or less a month Oral steroids no more than once a year No hospitalizations  GERD - Controlled - Use Pepcid  twice daily as needed for reflux.  -Avoid lying down for at least two hours after a meal or after drinking acidic  beverages, like soda, or other caffeinated beverages. This can help to prevent stomach contents from flowing back into the esophagus. -Keep your head elevated while you sleep. Using an extra pillow or two can also help to prevent reflux. -Eat smaller and more frequent meals each day instead of a few large meals. This promotes digestion and can aid in preventing heartburn. -Wear loose-fitting clothes to ease pressure on the stomach, which can worsen heartburn and reflux. -Reduce excess weight around the midsection. This can ease pressure on the stomach. Such pressure can force some stomach contents back up the esophagus.   History of Reaction with Shellfish - Has reintroduced shellfish at home without any issues.  Discussed this means she does not have a shellfish allergy and is fine to eat.  Previously with negative SPT to shellfish.   Return in about 4 months (around 04/06/2023).  Alesia Morin, MD [facility]

## 2022-12-05 NOTE — Patient Instructions (Addendum)
Return in about 4 months (around 04/06/2023).  Rhonda Walker  Allergic Rhinoconjunctivitis  - Positive skin test 10/2022: mold - Avoidance measures discussed. - Use nasal saline rinses before nose sprays such as with Neilmed Sinus Rinse.  Use distilled water.   - Use Flonase 2 sprays each nostril daily. Aim upward and outward. - Use Azelastine 1-2 sprays each nostril twice daily as needed. Aim upward and outward. - Use Zyrtec 10 mg daily.  - For eyes, use Olopatadine or Ketotifen 1 eye drop daily as needed for itchy, watery eyes.  Available over the counter, if not covered by insurance.  - Consider allergy shots as long term control of your symptoms by teaching your immune system to be more tolerant of your allergy triggers  Mild Intermittent Asthma: - MDI technique discussed.   - Maintenance inhaler: none - Rescue inhaler: Albuterol 2 puffs via spacer or 1 vial via nebulizer every 4-6 hours as needed for respiratory symptoms of cough, shortness of breath, or wheezing Asthma control goals:  Full participation in all desired activities (may need albuterol before activity) Albuterol use two times or less a week on average (not counting use with activity) Cough interfering with sleep two times or less a month Oral steroids no more than once a year No hospitalizations  GERD - Use Pepcid  twice daily as needed for reflux.  -Avoid lying down for at least two hours after a meal or after drinking acidic beverages, like soda, or other caffeinated beverages. This can help to prevent stomach contents from flowing back into the esophagus. -Keep your head elevated while you sleep. Using an extra pillow or two can also help to prevent reflux. -Eat smaller and more frequent meals each day instead of a few large meals. This promotes digestion and can aid in preventing heartburn. -Wear loose-fitting clothes to ease pressure on the stomach, which can worsen heartburn and reflux. -Reduce excess weight  around the midsection. This can ease pressure on the stomach. Such pressure can force some stomach contents back up the esophagus.

## 2023-04-03 ENCOUNTER — Ambulatory Visit: Payer: 59 | Admitting: Internal Medicine

## 2023-04-16 ENCOUNTER — Ambulatory Visit: Payer: 59 | Admitting: Internal Medicine

## 2023-04-23 ENCOUNTER — Encounter: Payer: Self-pay | Admitting: Internal Medicine

## 2023-04-23 ENCOUNTER — Ambulatory Visit: Payer: 59 | Admitting: Internal Medicine

## 2023-04-23 VITALS — BP 130/78 | HR 78 | Temp 98.0°F | Resp 17 | Wt 208.4 lb

## 2023-04-23 DIAGNOSIS — K2100 Gastro-esophageal reflux disease with esophagitis, without bleeding: Secondary | ICD-10-CM

## 2023-04-23 DIAGNOSIS — J3089 Other allergic rhinitis: Secondary | ICD-10-CM | POA: Diagnosis not present

## 2023-04-23 DIAGNOSIS — J4520 Mild intermittent asthma, uncomplicated: Secondary | ICD-10-CM

## 2023-04-23 NOTE — Patient Instructions (Addendum)
   Allergic Rhinoconjunctivitis  - Positive skin test 10/2022: mold - Avoidance measures discussed. - Use nasal saline rinses before nose sprays such as with Neilmed Sinus Rinse.  Use distilled water.   - Stop Flonase  - Use Azelastine 1-2 sprays each nostril twice daily as needed. Aim upward and outward. - Use Zyrtec 10 mg daily.  - For eyes, use Olopatadine or Ketotifen 1 eye drop daily as needed for itchy, watery eyes.  Available over the counter, if not covered by insurance.  - Consider allergy shots as long term control of your symptoms by teaching your immune system to be more tolerant of your allergy triggers  Mild Intermittent Asthma: Breathing tests looked great! - MDI technique discussed.   - Maintenance inhaler: none - Rescue inhaler: Albuterol 2 puffs via spacer or 1 vial via nebulizer every 4-6 hours as needed for respiratory symptoms of cough, shortness of breath, or wheezing Asthma control goals:  Full participation in all desired activities (may need albuterol before activity) Albuterol use two times or less a week on average (not counting use with activity) Cough interfering with sleep two times or less a month Oral steroids no more than once a year No hospitalizations  GERD - Use Pepcid 20mg  twice daily as needed for reflux.  -Continue dietary and lifestyle modifications - We will refer you to Gi for further evaluation   Follow up: 6 months   Thank you so much for letting me partake in your care today.  Don't hesitate to reach out if you have any additional concerns!  Ferol Luz, MD  [facility] [place] [place]

## 2023-04-23 NOTE — Progress Notes (Signed)
Follow Up Note  RE: [name] MRN: [id number] DOB: [birth year] Date of Office Visit: 04/23/2023  Referring provider: Kathaleen Walker* Primary care provider: Kerin Salen, PA-C  Chief Complaint: Follow-up (Pt states she's been doing well, she did' have some drying in right nostril from the flonase and she stopped it for awhile) and Asthma  History of Present Illness: I had the pleasure of seeing Rhonda Walker for a follow up visit at the [facility] on 04/24/2023. She is a 62 y.o. female, who is being followed for allergic rhinitis, intermittent asthma, GERD. Her previous allergy office visit was on 12/05/22 with  Dr. Allena Walker . Today is a regular follow up visit.  History obtained from patient, chart review.  Today she reports:  She did not tolerate flonase due to nasal dryness and minor bleeding.  Continued sinus rinses daily. Using astelin as needed. Zyrtec nightly.  Nasal and ocular symptoms are well controlled.  Will need astelin 1 x/week and eye drops once a month.  Needing albuterol 1-2 times per week, usually triggered by outdoor and exercise induced.  No OCS or Abx since last visit.  GERD is still problematic.  Worse in the evenings. Pepcid will control symptoms somewhat.  Has not seen GI.    Assessment and Plan: Rhonda Walker is a 62 y.o. female with: Mild intermittent asthma without complication - Plan: Spirometry with Graph  Perennial allergic rhinitis  Gastroesophageal reflux disease with esophagitis without hemorrhage - Plan: Ambulatory referral to Gastroenterology   Plan: Patient Instructions    Allergic Rhinoconjunctivitis  - Positive skin test 10/2022: mold - Avoidance measures discussed. - Use nasal saline rinses before nose sprays such as with Neilmed Sinus Rinse.  Use distilled water.   - Stop Flonase  - Use Azelastine 1-2 sprays each nostril twice daily as needed. Aim upward and outward. - Use Zyrtec 10 mg daily.  - For  eyes, use Olopatadine or Ketotifen 1 eye drop daily as needed for itchy, watery eyes.  Available over the counter, if not covered by insurance.  - Consider allergy shots as long term control of your symptoms by teaching your immune system to be more tolerant of your allergy triggers  Mild Intermittent Asthma: Breathing tests looked great! - MDI technique discussed.   - Maintenance inhaler: none - Rescue inhaler: Albuterol 2 puffs via spacer or 1 vial via nebulizer every 4-6 hours as needed for respiratory symptoms of cough, shortness of breath, or wheezing Asthma control goals:  Full participation in all desired activities (may need albuterol before activity) Albuterol use two times or less a week on average (not counting use with activity) Cough interfering with sleep two times or less a month Oral steroids no more than once a year No hospitalizations  GERD - Use Pepcid 20mg  twice daily as needed for reflux.  -Continue dietary and lifestyle modifications - We will refer you to Gi for further evaluation   Follow up: 6 months   Thank you so much for letting me partake in your care today.  Don't hesitate to reach out if you have any additional concerns!  Rhonda Luz, MD  [facility] [place] [place]    No orders of the defined types were placed in this encounter.   Lab Orders  No laboratory test(s) ordered today   Diagnostics: Spirometry:  Tracings reviewed. Her effort: Good reproducible efforts. FVC: 2.55L FEV1: 1.96L, 80% predicted FEV1/FVC ratio: 77% Interpretation: Spirometry consistent with normal  pattern.  Please see scanned spirometry results for details.    Medication List:  Current Outpatient Medications  Medication Sig Dispense Refill   albuterol (VENTOLIN HFA) 108 (90 Base) MCG/ACT inhaler Inhale 2 puffs into the lungs every 6 (six) hours as needed for wheezing or shortness of breath. 6.7 g 1   Azelastine HCl 0.15 % SOLN Place 1 spray into  the nose in the morning and at bedtime. 30 mL 5   carvedilol (COREG) 25 MG tablet Take by mouth.     cetirizine (ZYRTEC ALLERGY) 10 MG tablet Take 1 tablet (10 mg total) by mouth daily. 30 tablet 5   famotidine (PEPCID) 20 MG tablet Take 1 tablet (20 mg total) by mouth 2 (two) times daily as needed for heartburn or indigestion. 60 tablet 5   fluticasone (FLONASE) 50 MCG/ACT nasal spray Place 2 sprays into both nostrils daily. 16 g 5   Multiple Vitamins-Minerals (MULTIVITAMIN ADULT) TABS Take 1 tablet by mouth daily.     Olopatadine HCl 0.2 % SOLN Apply 1 drop to eye daily as needed (itchy watery eyes). 2.5 mL 5   azelastine (ASTELIN) 0.1 % nasal spray Place 2 sprays into both nostrils 2 (two) times daily. Use in each nostril as directed (Patient not taking: Reported on 04/23/2023) 30 mL 5   spironolactone (ALDACTONE) 50 MG tablet Take 50 mg by mouth 2 (two) times daily.     No current facility-administered medications for this visit.   Allergies: No Known Allergies I reviewed her past medical history, social history, family history, and environmental history and no significant changes have been reported from her previous visit.  ROS: All others negative except as noted per HPI.   Objective: BP 130/78   Pulse 78   Temp 98 F (36.7 C) (Temporal)   Resp 17   Wt 208 lb 6.4 oz (94.5 kg)   SpO2 98%   BMI 36.34 kg/m  Body mass index is 36.34 kg/m. General Appearance:  Alert, cooperative, no distress, appears stated age  Head:  Normocephalic, without obvious abnormality, atraumatic  Eyes:  Conjunctiva clear, EOM's intact  Nose: Nares normal, normal mucosa, no visible anterior polyps, and septum midline  Throat: Lips, tongue normal; teeth and gums normal, normal posterior oropharynx  Neck: Supple, symmetrical  Lungs:   clear to auscultation bilaterally, Respirations unlabored, no coughing  Heart:  regular rate and rhythm and no murmur, Appears well perfused  Extremities: No edema  Skin:  Skin color, texture, turgor normal, no rashes or lesions on visualized portions of skin  Neurologic: No gross deficits   Previous notes and tests were reviewed. The plan was reviewed with the patient/family, and all questions/concerned were addressed.  It was my pleasure to see Rhonda Walker today and participate in her care. Please feel free to contact me with any questions or concerns.  Sincerely,  Rhonda Luz, MD  Allergy & Immunology  [facility] Office: [phone]

## 2023-05-01 NOTE — Progress Notes (Signed)
Patient is aleady scheduled to see Marliss Coots, PA on 06-18-2023
